# Patient Record
Sex: Female | Born: 2000 | Race: White | Hispanic: No | Marital: Single | Smoking: Never smoker
Health system: Southern US, Community
[De-identification: ages and names within clinical notes are randomized; demographics above are authoritative.]

## PROBLEM LIST (undated history)

## (undated) ENCOUNTER — Ambulatory Visit: Admission: EM | Payer: Medicaid Other

## (undated) ENCOUNTER — Ambulatory Visit: Admission: EM | Payer: Medicaid Other | Source: Home / Self Care

## (undated) DIAGNOSIS — F909 Attention-deficit hyperactivity disorder, unspecified type: Secondary | ICD-10-CM

## (undated) DIAGNOSIS — F319 Bipolar disorder, unspecified: Secondary | ICD-10-CM

## (undated) DIAGNOSIS — F419 Anxiety disorder, unspecified: Secondary | ICD-10-CM

## (undated) DIAGNOSIS — F32A Depression, unspecified: Secondary | ICD-10-CM

## (undated) HISTORY — PX: TONSILLECTOMY: SUR1361

---
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
## 2018-05-07 ENCOUNTER — Inpatient Hospital Stay (HOSPITAL_COMMUNITY)
Admission: AD | Admit: 2018-05-07 | Discharge: 2018-05-07 | Disposition: A | Discharge status: Home / Self Care | Payer: Medicaid Other | Source: Ambulatory Visit | Attending: Obstetrics and Gynecology | Admitting: Obstetrics and Gynecology

## 2018-05-07 ENCOUNTER — Encounter (HOSPITAL_COMMUNITY): Payer: Self-pay | Admitting: *Deleted

## 2018-05-07 DIAGNOSIS — Z3A00 Weeks of gestation of pregnancy not specified: Secondary | ICD-10-CM | POA: Diagnosis not present

## 2018-05-07 DIAGNOSIS — O26899 Other specified pregnancy related conditions, unspecified trimester: Secondary | ICD-10-CM | POA: Diagnosis present

## 2018-05-07 DIAGNOSIS — O3680X0 Pregnancy with inconclusive fetal viability, not applicable or unspecified: Secondary | ICD-10-CM

## 2018-05-07 NOTE — MAU Note (Signed)
Cynthia Moran is a 17 y.o. at Unknown here in MAU: Was seen at pregnancy care center and sent here; endorses they did not see a gestational sac LMP: irregular periods; unsures Pain score: denies any at this time Denies vaginal bleeding  Vitals:   05/07/18 1446  BP: (!) 109/52  Pulse: 88  Resp: 16  Temp: 98 F (36.7 C)  SpO2: 100%     Lab orders placed from triage: none  Best contact number for patient: [phone]

## 2018-05-07 NOTE — MAU Provider Note (Signed)
Patient sent from pregnancy care center for evaluation of pregnancy of unknown anatomical location.  No IUP or sac was noted upon exam and patient referred to MAU.  Upon arrival to MICU patient denies any pain or bleeding.  I will set patient up for outpatient ultrasound and follow-up in clinic for 1 week.  Ectopic precautions given.

## 2018-05-22 ENCOUNTER — Inpatient Hospital Stay (HOSPITAL_COMMUNITY)
Admission: AD | Admit: 2018-05-22 | Discharge: 2018-05-22 | Discharge status: Against Medical Advice | Payer: Self-pay | Source: Ambulatory Visit | Attending: Obstetrics and Gynecology | Admitting: Obstetrics and Gynecology

## 2018-05-22 NOTE — Progress Notes (Signed)
Pt called for triage, not in lobby.

## 2018-05-22 NOTE — Progress Notes (Signed)
Pt called 3rd time to be triaged, no longer in lobby.  Will discharge.

## 2018-05-22 NOTE — Progress Notes (Signed)
Pt called into triage, not in lobby.

## 2019-10-04 ENCOUNTER — Ambulatory Visit: Payer: Medicaid Other | Attending: Internal Medicine

## 2019-10-04 DIAGNOSIS — Z20822 Contact with and (suspected) exposure to covid-19: Secondary | ICD-10-CM

## 2019-10-05 LAB — NOVEL CORONAVIRUS, NAA: SARS-CoV-2, NAA: NOT DETECTED

## 2020-10-25 ENCOUNTER — Other Ambulatory Visit: Payer: Self-pay

## 2020-10-25 ENCOUNTER — Emergency Department (HOSPITAL_COMMUNITY)
Admission: EM | Admit: 2020-10-25 | Discharge: 2020-10-25 | Disposition: A | Discharge status: Home / Self Care | Payer: Medicaid Other | Attending: Emergency Medicine | Admitting: Emergency Medicine

## 2020-10-25 DIAGNOSIS — R1030 Lower abdominal pain, unspecified: Secondary | ICD-10-CM | POA: Diagnosis not present

## 2020-10-25 DIAGNOSIS — Z5321 Procedure and treatment not carried out due to patient leaving prior to being seen by health care provider: Secondary | ICD-10-CM | POA: Insufficient documentation

## 2020-10-25 LAB — CBC
HCT: 40.2 % (ref 36.0–46.0)
Hemoglobin: 12.9 g/dL (ref 12.0–15.0)
MCH: 28.9 pg (ref 26.0–34.0)
MCHC: 32.1 g/dL (ref 30.0–36.0)
MCV: 90.1 fL (ref 80.0–100.0)
Platelets: 370 10*3/uL (ref 150–400)
RBC: 4.46 MIL/uL (ref 3.87–5.11)
RDW: 13.1 % (ref 11.5–15.5)
WBC: 5.6 10*3/uL (ref 4.0–10.5)
nRBC: 0 % (ref 0.0–0.2)

## 2020-10-25 LAB — COMPREHENSIVE METABOLIC PANEL
ALT: 14 U/L (ref 0–44)
AST: 17 U/L (ref 15–41)
Albumin: 3.8 g/dL (ref 3.5–5.0)
Alkaline Phosphatase: 60 U/L (ref 38–126)
Anion gap: 8 (ref 5–15)
BUN: 11 mg/dL (ref 6–20)
CO2: 28 mmol/L (ref 22–32)
Calcium: 9.1 mg/dL (ref 8.9–10.3)
Chloride: 104 mmol/L (ref 98–111)
Creatinine, Ser: 0.78 mg/dL (ref 0.44–1.00)
GFR, Estimated: >60 mL/min (ref >60)
Glucose, Bld: 76 mg/dL (ref 70–99)
Potassium: 3.9 mmol/L (ref 3.5–5.1)
Sodium: 140 mmol/L (ref 135–145)
Total Bilirubin: 0.6 mg/dL (ref 0.3–1.2)
Total Protein: 6.9 g/dL (ref 6.5–8.1)

## 2020-10-25 LAB — I-STAT BETA HCG BLOOD, ED (MC, WL, AP ONLY): I-stat hCG, quantitative: <5 m[IU]/mL (ref <5)

## 2020-10-25 LAB — LIPASE, BLOOD: Lipase: 29 U/L (ref 11–51)

## 2020-10-25 NOTE — ED Triage Notes (Signed)
Pt states she has lower abd cramps, not sure if it related to possible STD or she may be pregnant.  Recent boyfriend told her he " is hurting in the lower parts" ? discharge

## 2020-10-26 ENCOUNTER — Other Ambulatory Visit: Payer: Self-pay

## 2020-10-26 ENCOUNTER — Encounter (HOSPITAL_COMMUNITY): Payer: Self-pay | Admitting: Emergency Medicine

## 2020-10-26 ENCOUNTER — Emergency Department (HOSPITAL_COMMUNITY)
Admission: EM | Admit: 2020-10-26 | Discharge: 2020-10-26 | Disposition: A | Discharge status: Home / Self Care | Payer: Medicaid Other | Attending: Emergency Medicine | Admitting: Emergency Medicine

## 2020-10-26 DIAGNOSIS — N898 Other specified noninflammatory disorders of vagina: Secondary | ICD-10-CM | POA: Diagnosis not present

## 2020-10-26 DIAGNOSIS — R300 Dysuria: Secondary | ICD-10-CM | POA: Diagnosis present

## 2020-10-26 LAB — URINALYSIS, ROUTINE W REFLEX MICROSCOPIC
Bilirubin Urine: NEGATIVE
Glucose, UA: NEGATIVE mg/dL
Hgb urine dipstick: NEGATIVE
Ketones, ur: NEGATIVE mg/dL
Leukocytes,Ua: NEGATIVE
Nitrite: NEGATIVE
Protein, ur: NEGATIVE mg/dL
Specific Gravity, Urine: 1.021 (ref 1.005–1.030)
pH: 7 (ref 5.0–8.0)

## 2020-10-26 LAB — WET PREP, GENITAL
Sperm: NONE SEEN
Trich, Wet Prep: NONE SEEN
Yeast Wet Prep HPF POC: NONE SEEN

## 2020-10-26 MED ORDER — DOXYCYCLINE HYCLATE 100 MG PO CAPS: 100.0000 mg | ORAL_CAPSULE | Freq: Two times a day (BID) | ORAL | 0 refills | Status: AC

## 2020-10-26 MED ORDER — CEFTRIAXONE SODIUM 1 G IJ SOLR
500.0000 mg | Freq: Once | INTRAMUSCULAR | Status: AC
  Administered 2020-10-26: 500 mg via INTRAMUSCULAR
  Filled 2020-10-26: qty 10

## 2020-10-26 NOTE — ED Triage Notes (Signed)
Patient c/o dysuria and vaginal discomfort x1 week. Seen yesterday and LWBS after triage. Hx chlamydia.

## 2020-10-26 NOTE — Discharge Instructions (Addendum)
Abstain from intercourse until you know your test results. Call the hospital in 3-5 days for your results if you are unable to view results in your MyChart. IF your gonorrhea and chlamydia tests are negative, you may resume intercourse. IF either test is positive, your partner will need to go to the health department for free treatment. IF your test is positive, both partners need to abstain from intercourse for 10 days after treatment (this includes all forms of intercourse).    Take Doxycycline as prescribed and complete the full course. Consider follow up with the health department for HIV and syphilis testing.   There are many options for quick evaluation and management of certain GYN issues that do not require a long wait time or big bill from the emergency department.   Consider these options for your care in the future:   Walk-ins for certain complaints available at:   [facility] [address]  [phone]  See hours at [email or web address]   Center for [employer] [facility] [employer] [facility]  [address]  [phone]   [facility] [employer] [facility] [employer]  [phone]   You can make an appointment to see a GYN provider:   Center for [facility] at [facility]  [address]  [phone]   Center for [facility]  [address] [employer]  [phone]   Center for [facility] at [facility]  [phone]   Center for [facility] at [facility]  [address]  [phone]   If you already have an established OB/GYN provider in the area you can make an appointment with them as well.

## 2020-10-26 NOTE — ED Notes (Signed)
Pt has bene brought back to treatment room. Provider at bedside. Pt is currently getting undressed.

## 2020-10-26 NOTE — ED Provider Notes (Signed)
[facility]-EMERGENCY DEPT Provider Note   CSN: [id number] Arrival date & time: 10/26/20  1807     History Chief Complaint  Patient presents with  . Dysuria    Cynthia Moran is a 20 y.o. female.  20 year old female presents with complaint of abnormal vaginal discharge with dysuria.  States is not feeling her normal UTI as she is not having urinary frequency.  Patient is concerned for STD, states that her boyfriend recently complained of pain in his "private parts."  Patient states that she has been in relationship with this individual for a while however they recently broke up and was seeing other people before getting back together.  Last menstrual cycle was on January 9, did have a negative pregnancy test in the ED yesterday (LWBS).  No other complaints or concerns.        History reviewed. No pertinent past medical history.  There are no problems to display for this patient.   Past Surgical History:  Procedure Laterality Date  . CESAREAN SECTION    . TONSILLECTOMY       OB History   No obstetric history on file.     No family history on file.  Social History   Tobacco Use  . Smoking status: Never Smoker  . Smokeless tobacco: Never Used  Substance Use Topics  . Alcohol use: Not Currently    Comment: occaisonal   . Drug use: Not Currently    Types: Marijuana    Home Medications Prior to Admission medications   Medication Sig Start Date End Date Taking? Authorizing Provider  doxycycline (VIBRAMYCIN) 100 MG capsule Take 1 capsule (100 mg total) by mouth 2 (two) times daily for 7 days. 10/26/20 11/02/20 Yes Jeannie Fend, PA-C  Norgestimate-Ethinyl Estradiol Triphasic 0.18/0.215/0.25 MG-35 MCG tablet Take 1 tablet by mouth daily. 08/31/16  Yes [provider]    Allergies    Patient has no known allergies.  Review of Systems   Review of Systems  Constitutional: Negative for fever.  Gastrointestinal: Negative for abdominal  pain, constipation, diarrhea, nausea and vomiting.  Genitourinary: Positive for dysuria and vaginal discharge. Negative for frequency and vaginal bleeding.  Musculoskeletal: Negative for arthralgias, back pain and myalgias.  Skin: Negative for rash and wound.  Allergic/Immunologic: Negative for immunocompromised state.  All other systems reviewed and are negative.   Physical Exam Updated Vital Signs BP 131/78 (BP Location: Left Arm)   Pulse 85   Temp 98.6 F (37 C) (Oral)   Resp 20   Ht 5\' 2"  (1.575 m)   Wt 52.2 kg   LMP 10/20/2020   SpO2 98%   BMI 21.03 kg/m   Physical Exam Vitals and nursing note reviewed. Exam conducted with a chaperone present.  Constitutional:      General: She is not in acute distress.    Appearance: She is well-developed and well-nourished. She is not diaphoretic.  HENT:     Head: Normocephalic and atraumatic.  Cardiovascular:     Rate and Rhythm: Normal rate and regular rhythm.     Heart sounds: Normal heart sounds.  Pulmonary:     Effort: Pulmonary effort is normal.     Breath sounds: Normal breath sounds.  Abdominal:     Palpations: Abdomen is soft.     Tenderness: There is no abdominal tenderness.  Genitourinary:    Cervix: Discharge present. No cervical motion tenderness, friability or erythema.     Uterus: Not enlarged and not tender.  Adnexa: Right adnexa normal and left adnexa normal.     Comments: Large amount of yellow dc present without CMT.  Skin:    General: Skin is warm and dry.     Findings: No erythema or rash.  Neurological:     Mental Status: She is alert and oriented to person, place, and time.  Psychiatric:        Mood and Affect: Mood and affect normal.        Behavior: Behavior normal.     ED Results / Procedures / Treatments   Labs (all labs ordered are listed, but only abnormal results are displayed) Labs Reviewed  WET PREP, GENITAL - Abnormal; Notable for the following components:      Result Value   Clue  Cells Wet Prep HPF POC PRESENT (*)    WBC, Wet Prep HPF POC MODERATE (*)    All other components within normal limits  URINALYSIS, ROUTINE W REFLEX MICROSCOPIC - Abnormal; Notable for the following components:   APPearance HAZY (*)    All other components within normal limits  GC/CHLAMYDIA PROBE AMP ([place]) NOT AT [facility]    EKG None  Radiology No results found.  Procedures Procedures (including critical care time)  Medications Ordered in ED Medications  cefTRIAXone (ROCEPHIN) injection 500 mg (500 mg Intramuscular Given 10/26/20 2110)    ED Course  I have reviewed the triage vital signs and the nursing notes.  Pertinent labs & imaging results that were available during my care of the patient were reviewed by me and considered in my medical decision making (see chart for details).  Clinical Course as of 10/26/20 2140  Sat Oct 26, 2020  218 20 year old female with complaint of vaginal discharge as above.  On exam found to have large amount of yellow vaginal discharge without cervical motion tenderness or adnexal tenderness.  Plan is to cover with doxycycline and Rocephin today.  Wet prep pending and will result today, we discharged after wet prep available.  GC chlamydia sent out, patient is aware she will need to follow-up with her results.  Advised to abstain from intercourse for the next 10 days and be sure to have partner treated if any of her test results are positive. Labs reviewed from yesterday, CBC and CMP unremarkable, hcg negative. [LM]  2053 Wet prep is positive for clue cells, plan is to treat with Rocephin and doxycycline for suspected STD at this time.  Patient can follow-up with gynecology clinic for further complaints or if symptomatic for her BV. UA unremarkable, negative for UTI. [LM]    Clinical Course User Index [LM] Alden Hipp   MDM Rules/Calculators/A&P                          Final Clinical Impression(s) / ED Diagnoses Final diagnoses:   Vaginal discharge    Rx / DC Orders ED Discharge Orders         Ordered    doxycycline (VIBRAMYCIN) 100 MG capsule  2 times daily        10/26/20 2139           Jeannie Fend, PA-C 10/26/20 2140    Terrilee Files, MD 10/27/20 1003

## 2020-10-28 LAB — GC/CHLAMYDIA PROBE AMP (~~LOC~~) NOT AT ARMC
Chlamydia: NEGATIVE
Comment: NEGATIVE
Comment: NORMAL
Neisseria Gonorrhea: NEGATIVE

## 2020-11-01 ENCOUNTER — Encounter (HOSPITAL_COMMUNITY): Payer: Self-pay

## 2020-11-01 ENCOUNTER — Emergency Department (HOSPITAL_COMMUNITY)
Admission: EM | Admit: 2020-11-01 | Discharge: 2020-11-01 | Disposition: A | Discharge status: Home / Self Care | Payer: Medicaid Other | Attending: Emergency Medicine | Admitting: Emergency Medicine

## 2020-11-01 ENCOUNTER — Other Ambulatory Visit: Payer: Self-pay

## 2020-11-01 DIAGNOSIS — J069 Acute upper respiratory infection, unspecified: Secondary | ICD-10-CM | POA: Insufficient documentation

## 2020-11-01 DIAGNOSIS — Z20822 Contact with and (suspected) exposure to covid-19: Secondary | ICD-10-CM | POA: Diagnosis not present

## 2020-11-01 DIAGNOSIS — Z9089 Acquired absence of other organs: Secondary | ICD-10-CM | POA: Insufficient documentation

## 2020-11-01 DIAGNOSIS — R059 Cough, unspecified: Secondary | ICD-10-CM | POA: Diagnosis present

## 2020-11-01 MED ORDER — BENZONATATE 100 MG PO CAPS: 100.0000 mg | ORAL_CAPSULE | Freq: Three times a day (TID) | ORAL | 0 refills | Status: DC

## 2020-11-01 MED ORDER — FLUTICASONE PROPIONATE 50 MCG/ACT NA SUSP: 2.0000 | Freq: Every day | NASAL | 0 refills | Status: DC

## 2020-11-01 NOTE — Discharge Instructions (Addendum)
I prescribed you 2 medications.  First medication is called Flonase.  Studies have shown that using Flonase 2 times a day has helped significantly reduce the chances of being hospitalized for COVID-19.  I would recommend using this 2 times per day in each nostril.  The other medication is called Tessalon Perles.  This is a cough medication you can take up to 3 times a day for your cough.  If you find that it is not effective, you can try over-the-counter medications such as Robitussin or Delsym.  If you develop new or worsening symptoms, please return to the emergency department.  Otherwise, please follow-up with your primary care provider.  It was a pleasure to meet you.  Check the results of your COVID-19 test on MyChart.  If it is positive, you need to quarantine for 10 days from symptom onset.

## 2020-11-01 NOTE — ED Triage Notes (Signed)
Patient c/o chills, cough, emesis, and sore throat. Patient states one of her friends told her that their parent had Covid.

## 2020-11-01 NOTE — ED Provider Notes (Signed)
[facility]-EMERGENCY DEPT Provider Note   CSN: [id number] Arrival date & time: 11/01/20  1549     History No chief complaint on file.   Cynthia Moran is a 20 y.o. female.  HPI Patient is a 20 year old female who presents to the emergency department due to a cough.  Reports associated body aches, chills, vomiting, as well as sore throat.  Symptoms started about 2 to 3 days ago.  She was around a friend who had a COVID positive parent prior to the onset of her symptoms.  She has been vaccinated for COVID x1 and also notes having had COVID in the past.  Denies any significant shortness of breath.  Presents today for COVID-19 testing.  No abdominal pain or diarrhea.     History reviewed. No pertinent past medical history.  There are no problems to display for this patient.   Past Surgical History:  Procedure Laterality Date  . CESAREAN SECTION    . TONSILLECTOMY       OB History   No obstetric history on file.     Family History  Problem Relation Age of Onset  . Healthy Mother   . Healthy Father     Social History   Tobacco Use  . Smoking status: Never Smoker  . Smokeless tobacco: Never Used  Vaping Use  . Vaping Use: Some days  . Substances: Nicotine  Substance Use Topics  . Alcohol use: Not Currently    Comment: occaisonal   . Drug use: Not Currently    Types: Marijuana    Home Medications Prior to Admission medications   Medication Sig Start Date End Date Taking? Authorizing Provider  benzonatate (TESSALON) 100 MG capsule Take 1 capsule (100 mg total) by mouth every 8 (eight) hours. 11/01/20  Yes Placido Sou, PA-C  fluticasone (FLONASE) 50 MCG/ACT nasal spray Place 2 sprays into both nostrils daily. 11/01/20  Yes Placido Sou, PA-C  doxycycline (VIBRAMYCIN) 100 MG capsule Take 1 capsule (100 mg total) by mouth 2 (two) times daily for 7 days. 10/26/20 11/02/20  Jeannie Fend, PA-C  Norgestimate-Ethinyl Estradiol Triphasic  0.18/0.215/0.25 MG-35 MCG tablet Take 1 tablet by mouth daily. 08/31/16   [provider]    Allergies    Patient has no known allergies.  Review of Systems   Review of Systems  Constitutional: Positive for chills and fatigue. Negative for fever.  HENT: Positive for congestion, postnasal drip, rhinorrhea, sneezing and sore throat. Negative for ear discharge, ear pain and trouble swallowing.   Respiratory: Positive for cough. Negative for shortness of breath.   Gastrointestinal: Positive for vomiting. Negative for abdominal pain and diarrhea.   Physical Exam Updated Vital Signs BP 110/64 (BP Location: Left Arm)   Pulse 71   Temp 97.7 F (36.5 C) (Oral)   Resp 16   Ht 5\' 2"  (1.575 m)   Wt 52.2 kg   LMP 10/20/2020   SpO2 99%   BMI 21.03 kg/m   Physical Exam Vitals and nursing note reviewed.  Constitutional:      General: She is not in acute distress.    Appearance: Normal appearance. She is not ill-appearing, toxic-appearing or diaphoretic.  HENT:     Head: Normocephalic and atraumatic.     Right Ear: External ear normal.     Left Ear: External ear normal.     Nose: Nose normal.     Mouth/Throat:     Mouth: Mucous membranes are moist.     Pharynx: Oropharynx  is clear. No oropharyngeal exudate or posterior oropharyngeal erythema.     Comments: Status post tonsillectomy.  Uvula midline.  No significant erythema noted in the posterior oropharynx.  No exudates.  Readily handling secretions.  No hot potato voice. Eyes:     Extraocular Movements: Extraocular movements intact.  Cardiovascular:     Rate and Rhythm: Normal rate and regular rhythm.     Pulses: Normal pulses.     Heart sounds: Normal heart sounds. No murmur heard. No friction rub. No gallop.      Comments: Regular rate and rhythm without murmurs, rubs, or gallops. Pulmonary:     Effort: Pulmonary effort is normal. No respiratory distress.     Breath sounds: Normal breath sounds. No stridor. No wheezing,  rhonchi or rales.     Comments: Lungs are clear to auscultation bilaterally. Abdominal:     General: Abdomen is flat.     Palpations: Abdomen is soft.     Tenderness: There is no abdominal tenderness.     Comments: Abdomen is flat, soft, and nontender.  Musculoskeletal:        General: Normal range of motion.     Cervical back: Normal range of motion and neck supple. No tenderness.  Skin:    General: Skin is warm and dry.  Neurological:     General: No focal deficit present.     Mental Status: She is alert and oriented to person, place, and time.  Psychiatric:        Mood and Affect: Mood normal.        Behavior: Behavior normal.     ED Results / Procedures / Treatments   Labs (all labs ordered are listed, but only abnormal results are displayed) Labs Reviewed  SARS CORONAVIRUS 2 (TAT 6-24 HRS)    EKG None  Radiology No results found.  Procedures Procedures (including critical care time)  Medications Ordered in ED Medications - No data to display  ED Course  I have reviewed the triage vital signs and the nursing notes.  Pertinent labs & imaging results that were available during my care of the patient were reviewed by me and considered in my medical decision making (see chart for details).    MDM Rules/Calculators/A&P                          Pt is a 20 y.o. female who presents to the emergency department with multiple symptoms consistent with viral URI.  Vaccinated for COVID-19 x1.  Also reports having had COVID-19 in the past.  No significant shortness of breath.  Not hypoxic.  Afebrile.  Not tachycardic.  Physical exam reassuring.  Patient is nontoxic-appearing.  We will obtain a 6 to 24-hour COVID-19 test.  Patient has MyChart and is going to check the results at home.  She understands to quarantine for 10 to 14 days from symptom onset.  We discussed return precautions.  We will discharge patient on Tessalon Perles as well as Flonase.  Discussed OTC medications as  well.  Negative pregnancy test 1 week ago and patient denies any sexual activity since prior visit.  Her questions were answered and she was amicable at the time of discharge.  Her vital signs are stable.  Note: Portions of this report may have been transcribed using voice recognition software. Every effort was made to ensure accuracy; however, inadvertent computerized transcription errors may be present.   Final Clinical Impression(s) / ED Diagnoses Final diagnoses:  Viral URI with cough   Rx / DC Orders ED Discharge Orders         Ordered    fluticasone (FLONASE) 50 MCG/ACT nasal spray  Daily        11/01/20 1627    benzonatate (TESSALON) 100 MG capsule  Every 8 hours        11/01/20 1627           Placido Sou, PA-C 11/01/20 1632    Terald Sleeper, MD 11/01/20 2208

## 2020-11-02 LAB — SARS CORONAVIRUS 2 (TAT 6-24 HRS): SARS Coronavirus 2: NEGATIVE

## 2020-12-19 ENCOUNTER — Other Ambulatory Visit: Payer: Self-pay

## 2020-12-19 ENCOUNTER — Encounter (HOSPITAL_COMMUNITY): Payer: Self-pay | Admitting: Pharmacy Technician

## 2020-12-19 ENCOUNTER — Emergency Department (HOSPITAL_COMMUNITY)
Admission: EM | Admit: 2020-12-19 | Discharge: 2020-12-19 | Disposition: A | Discharge status: Home / Self Care | Payer: Medicaid Other | Attending: Emergency Medicine | Admitting: Emergency Medicine

## 2020-12-19 DIAGNOSIS — Z5321 Procedure and treatment not carried out due to patient leaving prior to being seen by health care provider: Secondary | ICD-10-CM | POA: Diagnosis not present

## 2020-12-19 DIAGNOSIS — N898 Other specified noninflammatory disorders of vagina: Secondary | ICD-10-CM | POA: Diagnosis not present

## 2020-12-19 NOTE — ED Triage Notes (Signed)
Pt reports vaginal itching, discharge and odor for 2 days. Denies having unprotected sex.

## 2021-01-07 ENCOUNTER — Ambulatory Visit
Admission: EM | Admit: 2021-01-07 | Discharge: 2021-01-07 | Disposition: A | Discharge status: Home / Self Care | Payer: Medicaid Other | Attending: Physician Assistant | Admitting: Physician Assistant

## 2021-01-07 ENCOUNTER — Encounter: Payer: Self-pay | Admitting: Emergency Medicine

## 2021-01-07 ENCOUNTER — Other Ambulatory Visit: Payer: Self-pay

## 2021-01-07 DIAGNOSIS — B9689 Other specified bacterial agents as the cause of diseases classified elsewhere: Secondary | ICD-10-CM | POA: Diagnosis present

## 2021-01-07 DIAGNOSIS — N939 Abnormal uterine and vaginal bleeding, unspecified: Secondary | ICD-10-CM | POA: Diagnosis not present

## 2021-01-07 DIAGNOSIS — N760 Acute vaginitis: Secondary | ICD-10-CM | POA: Diagnosis not present

## 2021-01-07 LAB — WET PREP, GENITAL
Sperm: NONE SEEN
Trich, Wet Prep: NONE SEEN
Yeast Wet Prep HPF POC: NONE SEEN

## 2021-01-07 LAB — PREGNANCY, URINE: Preg Test, Ur: NEGATIVE

## 2021-01-07 MED ORDER — METRONIDAZOLE 500 MG PO TABS: 500.0000 mg | ORAL_TABLET | Freq: Two times a day (BID) | ORAL | 0 refills | Status: AC

## 2021-01-07 NOTE — ED Triage Notes (Signed)
Patient states she was seen 2 weeks ago for BV but never got the treatment. She also reports vaginal bleeding with clots that started 2 days ago but her last menstrual cycle was 2 weeks ago.

## 2021-01-07 NOTE — ED Provider Notes (Signed)
[facility]    CSN: [id number] Arrival date & time: 01/07/21  1022      History   Chief Complaint Chief Complaint  Patient presents with  . Vaginal Bleeding    HPI Cynthia Moran is a 20 y.o. female presenting for concerns about abnormal vaginal bleeding.  Patient states that she has had 2-day history of clumpy/clotted vaginal bleeding whenever she goes to the bathroom.  She admits to occasional blood clots on her pad.  She says it has slowed down from yesterday.  Patient says it looks like mucus also.  She reports some pelvic/abdominal pain with intercourse but not otherwise.  Patient was seen in the emergency department a couple of weeks ago for vaginal discharge and was diagnosed with bacterial vaginosis.  She was prescribed MetroGel, but says the pharmacy never received it so she did not take it.  She was tested for STIs at that time and results were negative.  She says that her and her boyfriend broke up and they both had other sexual partners.  She says that they both told he showed a use protection but she was not convinced so that is why she went to the ER to get STI tested.  Patient takes an oral contraceptive and states that her periods are usually very regular and once a month.  She has never had any midcycle or breakthrough bleeding.  Has been on this form of birth control for a few months.  She denies any fever, fatigue or weakness, nausea/vomiting, dysuria or urinary frequency.  No bleeding from other sites.  No other concerns.  HPI  History reviewed. No pertinent past medical history.  There are no problems to display for this patient.   Past Surgical History:  Procedure Laterality Date  . CESAREAN SECTION    . TONSILLECTOMY      OB History   No obstetric history on file.      Home Medications    Prior to Admission medications   Medication Sig Start Date End Date Taking? Authorizing Provider  metroNIDAZOLE (FLAGYL) 500 MG tablet Take 1 tablet (500  mg total) by mouth 2 (two) times daily for 7 days. 01/07/21 01/14/21 Yes Shirlee Latch, PA-C  Norgestimate-Ethinyl Estradiol Triphasic 0.18/0.215/0.25 MG-35 MCG tablet Take 1 tablet by mouth daily. 08/31/16  Yes [provider]  benzonatate (TESSALON) 100 MG capsule Take 1 capsule (100 mg total) by mouth every 8 (eight) hours. 11/01/20   Placido Sou, PA-C  fluticasone (FLONASE) 50 MCG/ACT nasal spray Place 2 sprays into both nostrils daily. 11/01/20   Placido Sou, PA-C    Family History Family History  Problem Relation Age of Onset  . Healthy Mother   . Healthy Father     Social History Social History   Tobacco Use  . Smoking status: Never Smoker  . Smokeless tobacco: Never Used  Vaping Use  . Vaping Use: Some days  . Substances: Nicotine  Substance Use Topics  . Alcohol use: Not Currently    Comment: occaisonal   . Drug use: Not Currently    Types: Marijuana     Allergies   Patient has no known allergies.   Review of Systems Review of Systems  Constitutional: Negative for chills, fatigue and fever.  Gastrointestinal: Negative for abdominal pain, diarrhea, nausea and vomiting.  Genitourinary: Positive for dyspareunia, menstrual problem, vaginal bleeding and vaginal discharge. Negative for decreased urine volume, difficulty urinating, dysuria, flank pain, frequency, hematuria, pelvic pain, urgency and vaginal pain.  Musculoskeletal: Negative for back pain.  Skin: Negative for rash.  Hematological: Does not bruise/bleed easily.     Physical Exam Triage Vital Signs ED Triage Vitals  Enc Vitals Group     BP 01/07/21 1041 109/68     Pulse Rate 01/07/21 1041 96     Resp 01/07/21 1041 18     Temp 01/07/21 1041 98 F (36.7 C)     Temp Source 01/07/21 1041 Oral     SpO2 01/07/21 1041 98 %     Weight 01/07/21 1039 110 lb (49.9 kg)     Height 01/07/21 1039 5' (1.524 m)     Head Circumference --      Peak Flow --      Pain Score 01/07/21 1039 7      Pain Loc --      Pain Edu? --      Excl. in GC? --    No data found.  Updated Vital Signs BP 109/68 (BP Location: Left Arm)   Pulse 96   Temp 98 F (36.7 C) (Oral)   Resp 18   Ht 5' (1.524 m)   Wt 110 lb (49.9 kg)   LMP 12/24/2020   SpO2 98%   BMI 21.48 kg/m       Physical Exam Vitals and nursing note reviewed.  Constitutional:      General: She is not in acute distress.    Appearance: Normal appearance. She is not ill-appearing or toxic-appearing.  HENT:     Head: Normocephalic and atraumatic.  Eyes:     General: No scleral icterus.       Right eye: No discharge.        Left eye: No discharge.     Conjunctiva/sclera: Conjunctivae normal.  Cardiovascular:     Rate and Rhythm: Normal rate and regular rhythm.     Heart sounds: Normal heart sounds.  Pulmonary:     Effort: Pulmonary effort is normal. No respiratory distress.     Breath sounds: Normal breath sounds.  Abdominal:     Palpations: Abdomen is soft.     Tenderness: There is no abdominal tenderness. There is no right CVA tenderness or left CVA tenderness.  Genitourinary:    Comments: Deferred-patient declined Musculoskeletal:     Cervical back: Neck supple.  Skin:    General: Skin is dry.  Neurological:     General: No focal deficit present.     Mental Status: She is alert. Mental status is at baseline.     Motor: No weakness.     Gait: Gait normal.  Psychiatric:        Mood and Affect: Mood normal.        Behavior: Behavior normal.        Thought Content: Thought content normal.      UC Treatments / Results  Labs (all labs ordered are listed, but only abnormal results are displayed) Labs Reviewed  WET PREP, GENITAL - Abnormal; Notable for the following components:      Result Value   Clue Cells Wet Prep HPF POC PRESENT (*)    WBC, Wet Prep HPF POC MANY (*)    All other components within normal limits  PREGNANCY, URINE    EKG   Radiology No results found.  Procedures Procedures  (including critical care time)  Medications Ordered in UC Medications - No data to display  Initial Impression / Assessment and Plan / UC Course  I have reviewed the triage vital signs  and the nursing notes.  Pertinent labs & imaging results that were available during my care of the patient were reviewed by me and considered in my medical decision making (see chart for details).   20 y/o female presents for abnormal vaginal bleeding x2 days.  Tested positive in the ED for BV but not treated.  All vital signs normal and stable. Advised pelvic exam but she declines at this time, and self swabbed for wet prep. Negative pregnancy.  Wet prep positive for clue cells.  Will treat for BV infection with metronidazole. Patient declines retesting for STIs since she tested -2 weeks ago.  Patient declines pelvic exam today.  She has decided to monitor the condition of the bleeding becomes severe to go to the ED.  Otherwise, she will call her gynecologist for appointment and consideration of a different type of birth control.  I reviewed ED red flag signs or symptoms for abnormal vaginal bleeding and pelvic pain.  Final Clinical Impressions(s) / UC Diagnoses   Final diagnoses:  Abnormal vaginal bleeding  BV (bacterial vaginosis)     Discharge Instructions     Your pregnancy test is negative.  Your vaginal swab is indicative of BV infection.  I have sent metronidazole to the pharmacy.  Make sure you take the full course.  If there is a problem with the pharmacy receiving medication, please call us and I can send it somewhere else.  You need to be treated for this infection.  You have decided to hold off on a pelvic exam today.  Continue to monitor your bleeding and if you have continued bleeding after another 3 days or so you should be seen again.  The bleeding is starting to slow down and you can continue to monitor.  It is not extremely uncommon to have some midcycle bleeding every now and then.   If you have any fatigue with this or pelvic/abdominal pain then you need to go to the emergency room.  Do not have any sex until this is improving.  We did not repeat STI testing since you just had that done a couple weeks ago.    ED Prescriptions    Medication Sig Dispense Auth. Provider   metroNIDAZOLE (FLAGYL) 500 MG tablet Take 1 tablet (500 mg total) by mouth 2 (two) times daily for 7 days. 14 tablet Gareth Morgan     PDMP not reviewed this encounter.   Shirlee Latch, PA-C 01/07/21 1143

## 2021-01-07 NOTE — Discharge Instructions (Signed)
Your pregnancy test is negative.  Your vaginal swab is indicative of BV infection.  I have sent metronidazole to the pharmacy.  Make sure you take the full course.  If there is a problem with the pharmacy receiving medication, please call us and I can send it somewhere else.  You need to be treated for this infection.  You have decided to hold off on a pelvic exam today.  Continue to monitor your bleeding and if you have continued bleeding after another 3 days or so you should be seen again.  The bleeding is starting to slow down and you can continue to monitor.  It is not extremely uncommon to have some midcycle bleeding every now and then.  If you have any fatigue with this or pelvic/abdominal pain then you need to go to the emergency room.  Do not have any sex until this is improving.  We did not repeat STI testing since you just had that done a couple weeks ago.

## 2021-04-01 ENCOUNTER — Encounter: Payer: Self-pay | Admitting: *Deleted

## 2021-04-01 ENCOUNTER — Other Ambulatory Visit: Payer: Self-pay

## 2021-04-01 ENCOUNTER — Emergency Department
Admission: EM | Admit: 2021-04-01 | Discharge: 2021-04-02 | Disposition: A | Discharge status: Home / Self Care | Payer: Medicaid Other | Attending: Emergency Medicine | Admitting: Emergency Medicine

## 2021-04-01 DIAGNOSIS — N760 Acute vaginitis: Secondary | ICD-10-CM | POA: Diagnosis not present

## 2021-04-01 DIAGNOSIS — N939 Abnormal uterine and vaginal bleeding, unspecified: Secondary | ICD-10-CM

## 2021-04-01 DIAGNOSIS — B9689 Other specified bacterial agents as the cause of diseases classified elsewhere: Secondary | ICD-10-CM | POA: Insufficient documentation

## 2021-04-01 MED ORDER — METRONIDAZOLE 500 MG/100ML IV SOLN
500.0000 mg | Freq: Once | INTRAVENOUS | Status: AC
  Administered 2021-04-01: 500 mg via INTRAVENOUS
  Filled 2021-04-01: qty 100

## 2021-04-01 MED ORDER — METRONIDAZOLE 500 MG PO TABS: 500.0000 mg | ORAL_TABLET | Freq: Two times a day (BID) | ORAL | 0 refills | Status: AC

## 2021-04-01 NOTE — ED Provider Notes (Signed)
[facility] Emergency Department Provider Note   ____________________________________________   Event Date/Time   First MD Initiated Contact with Patient 04/01/21 2121     (approximate)  I have reviewed the triage vital signs and the nursing notes.   HISTORY  Chief Complaint Vaginal Bleeding    HPI Cynthia Moran is a 20 y.o. female with the below stated past medical history presents for a small amount of vaginal bleeding and vaginal itching that is been going on for the last 3 days.  Patient states that this symptoms are similar to when she has had bacterial vaginosis in the past.  Patient states that she has tried to treat this at home by taking over-the-counter medications and have not improved any of the symptoms.  Patient states that she has to use 1 pad per day due to a small amount of bleeding.  Patient states her last menstrual period was 1 week prior to arrival.  Patient currently denies any vision changes, tinnitus, difficulty speaking, facial droop, sore throat, chest pain, shortness of breath, abdominal pain, nausea/vomiting/diarrhea, or weakness/numbness/paresthesias in any extremity         No past medical history on file.  There are no problems to display for this patient.   Past Surgical History:  Procedure Laterality Date   CESAREAN SECTION     TONSILLECTOMY      Prior to Admission medications   Medication Sig Start Date End Date Taking? Authorizing Provider  metroNIDAZOLE (FLAGYL) 500 MG tablet Take 1 tablet (500 mg total) by mouth 2 (two) times daily for 7 days. 04/01/21 04/08/21 Yes Verdelle Valtierra, Clent Jacks, MD  benzonatate (TESSALON) 100 MG capsule Take 1 capsule (100 mg total) by mouth every 8 (eight) hours. 11/01/20   Placido Sou, PA-C  fluticasone (FLONASE) 50 MCG/ACT nasal spray Place 2 sprays into both nostrils daily. 11/01/20   Placido Sou, PA-C  Norgestimate-Ethinyl Estradiol Triphasic 0.18/0.215/0.25 MG-35 MCG tablet Take 1  tablet by mouth daily. 08/31/16   [provider]    Allergies Patient has no known allergies.  Family History  Problem Relation Age of Onset   Healthy Mother    Healthy Father     Social History Social History   Tobacco Use   Smoking status: Never   Smokeless tobacco: Never  Vaping Use   Vaping Use: Some days   Substances: Nicotine  Substance Use Topics   Alcohol use: Not Currently    Comment: occaisonal    Drug use: Not Currently    Types: Marijuana    Review of Systems Constitutional: No fever/chills Eyes: No visual changes. ENT: No sore throat. Cardiovascular: Denies chest pain. Respiratory: Denies shortness of breath. Gastrointestinal: No abdominal pain.  No nausea, no vomiting.  No diarrhea. Genitourinary: Positive for dysuria and a small amount of vaginal bleeding Musculoskeletal: Negative for acute arthralgias Skin: Negative for rash. Neurological: Negative for headaches, weakness/numbness/paresthesias in any extremity Psychiatric: Negative for suicidal ideation/homicidal ideation   ____________________________________________   PHYSICAL EXAM:  VITAL SIGNS: ED Triage Vitals  Enc Vitals Group     BP 04/01/21 2111 136/90     Pulse Rate 04/01/21 2111 (!) 118     Resp 04/01/21 2111 16     Temp 04/01/21 2111 97.7 F (36.5 C)     Temp Source 04/01/21 2111 Oral     SpO2 04/01/21 2111 100 %     Weight 04/01/21 2110 120 lb (54.4 kg)     Height 04/01/21 2110 5\' 1"  (1.549  m)     Head Circumference --      Peak Flow --      Pain Score 04/01/21 2110 0     Pain Loc --      Pain Edu? --      Excl. in GC? --    Constitutional: Alert and oriented. Well appearing and in no acute distress. Eyes: Conjunctivae are normal. PERRL. Head: Atraumatic. Nose: No congestion/rhinnorhea. Mouth/Throat: Mucous membranes are moist. Neck: No stridor Cardiovascular: Grossly normal heart sounds.  Good peripheral circulation. Respiratory: Normal respiratory effort.   No retractions. Gastrointestinal: Soft and nontender. No distention. Genitourinary: Patient deferred exam as she states she wanted to self swab.  Patient refused pelvic exam as well Musculoskeletal: No obvious deformities Neurologic:  Normal speech and language. No gross focal neurologic deficits are appreciated. Skin:  Skin is warm and dry. No rash noted. Psychiatric: Mood and affect are normal. Speech and behavior are normal.  ____________________________________________   LABS (all labs ordered are listed, but only abnormal results are displayed)  Labs Reviewed  CHLAMYDIA/NGC RT PCR ([facility] ONLY)            URINALYSIS, COMPLETE (UACMP) WITH MICROSCOPIC   PROCEDURES  Procedure(s) performed (including Critical Care):  Procedures   ____________________________________________   INITIAL IMPRESSION / ASSESSMENT AND PLAN / ED COURSE  As part of my medical decision making, I reviewed the following data within the electronic MEDICAL RECORD NUMBER Nursing notes reviewed and incorporated, Labs reviewed, EKG interpreted, Old chart reviewed, Radiograph reviewed and Notes from prior ED visits reviewed and incorporated        1 day vaginal bleeding most likely of nonemergent etiology.  Workup: UA, GC/chlamydia  Based on History, Exam, and Workup I believe the patient's presentation not consistent with ectopic pregnancy, molar pregnancy, life-threatening coagulopathy, trauma, serious bacterial infection, central process or other emergency.  Patient Stable Appearing and presentation most likely secondary to fibroids or other non-emergent cause of abnormal uterine bleeding. Will treat empirically for bacterial vaginosis this patient states that she does not want a wait for her results.  Will provide prescription with metronidazole  Disposition: Will discharge home with return precautions and instruction for prompt OBGYN follow up.       ____________________________________________   FINAL CLINICAL IMPRESSION(S) / ED DIAGNOSES  Final diagnoses:  BV (bacterial vaginosis)  Abnormal vaginal bleeding     ED Discharge Orders          Ordered    metroNIDAZOLE (FLAGYL) 500 MG tablet  2 times daily        04/01/21 2139             Note:  This document was prepared using Dragon voice recognition software and may include unintentional dictation errors.    Merwyn Katos, MD 04/01/21 [phone]

## 2021-04-01 NOTE — ED Triage Notes (Signed)
Pt says that she has had vaginal bleeding for several days with an odor. Pt denies pain.

## 2021-04-02 LAB — URINALYSIS, COMPLETE (UACMP) WITH MICROSCOPIC
Bacteria, UA: NONE SEEN
Bilirubin Urine: NEGATIVE
Glucose, UA: NEGATIVE mg/dL
Hgb urine dipstick: NEGATIVE
Ketones, ur: NEGATIVE mg/dL
Leukocytes,Ua: NEGATIVE
Nitrite: NEGATIVE
Protein, ur: NEGATIVE mg/dL
Specific Gravity, Urine: 1.018 (ref 1.005–1.030)
pH: 5 (ref 5.0–8.0)

## 2021-07-08 ENCOUNTER — Ambulatory Visit (HOSPITAL_BASED_OUTPATIENT_CLINIC_OR_DEPARTMENT_OTHER): Payer: Medicaid Other | Admitting: Nurse Practitioner

## 2021-08-25 ENCOUNTER — Ambulatory Visit (HOSPITAL_BASED_OUTPATIENT_CLINIC_OR_DEPARTMENT_OTHER): Payer: Medicaid Other | Admitting: Nurse Practitioner

## 2021-08-25 NOTE — Patient Instructions (Incomplete)
Thank you for choosing [facility] for your Primary Care needs. I am excited for the opportunity to partner with you to meet your health care goals. It was a pleasure meeting you today! ° °Recommendations from today's visit: °*** ° °Information on diet, exercise, and health maintenance recommendations are listed below. This is information to help you be sure you are on track for optimal health and monitoring.  ° °Please look over this and let us know if you have any questions or if you have completed any of the health maintenance outside of [facility] so that we can be sure your records are up to date.  °___________________________________________________________ °About Me: °I am an Adult-Geriatric Nurse Practitioner with a background in caring for patients for more than 20 years with a strong intensive care background. I provide primary care and sports medicine services to patients age 13 and older within this office. My education had a strong focus on caring for the older adult population, which I am passionate about. I am also the director of the [employer] with [employer].  ° °My desire is to provide you with the best service through preventive medicine and supportive care. I consider you a part of the medical team and value your input. I work diligently to ensure that you are heard and your needs are met in a safe and effective manner. I want you to feel comfortable with me as your provider and want you to know that your health concerns are important to me. ° °For your information, our office hours are: °Monday, Tuesday, and Thursday 8:00 AM - 5:00 PM °Wednesday and Friday 8:00 AM - 12:00 PM.  ° °In my time away from the office I am teaching new APP's within the system and am unavailable, but my partner, Dr. deCuba is in the office for emergent needs.  ° °If you have questions or concerns, please call our office at [phone] or send us a MyChart message and we will  respond as quickly as possible.  °____________________________________________________________ °MyChart:  °For all urgent or time sensitive needs we ask that you please call the office to avoid delays. Our number is [phone]. °MyChart is not constantly monitored and due to the large volume of messages a day, replies may take up to 72 business hours. ° °MyChart Policy: °MyChart allows for you to see your visit notes, after visit summary, provider recommendations, lab and tests results, make an appointment, request refills, and contact your provider or the office for non-urgent questions or concerns. Providers are seeing patients during normal business hours and do not have built in time to review MyChart messages.  °We ask that you allow a minimum of 3 business days for responses to MyChart messages. For this reason, please do not send urgent requests through MyChart. Please call the office at [phone]. °New and ongoing conditions may require a visit. We have virtual and in person visit available for your convenience.  °Complex MyChart concerns may require a visit. Your provider may request you schedule a virtual or in person visit to ensure we are providing the best care possible. °MyChart messages sent after 11:00 AM on Friday will not be received by the provider until Monday morning.  °  °Lab and Test Results: °You will receive your lab and test results on MyChart as soon as they are completed and results have been sent by the lab or testing facility. Due to this service, you will receive your results   BEFORE your provider.  I review lab and tests results each morning prior to seeing patients. Some results require collaboration with other providers to ensure you are receiving the most appropriate care. For this reason, we ask that you please allow a minimum of 3-5 business days from the time the ALL results have been received for your provider to receive and review lab and test results and contact you  about these.  Most lab and test result comments from the provider will be sent through [facility]. Your provider may recommend changes to the plan of care, follow-up visits, repeat testing, ask questions, or request an office visit to discuss these results. You may reply directly to this message or call the office at [phone] to provide information for the provider or set up an appointment. In some instances, you will be called with test results and recommendations. Please let us know if this is preferred and we will make note of this in your chart to provide this for you.    If you have not heard a response to your lab or test results in 5 business days from all results returning to [place], please call the office to let us know. We ask that you please avoid calling prior to this time unless there is an emergent concern. Due to high call volumes, this can delay the resulting process.  After Hours: For all non-emergency after hours needs, please call the office at [phone] and select the option to reach the on-call provider service. On-call services are shared between multiple [facility] offices and therefore it will not be possible to speak directly with your provider. On-call providers may provide medical advice and recommendations, but are unable to provide refills for maintenance medications.  For all emergency or urgent medical needs after normal business hours, we recommend that you seek care at the closest Urgent Care or Emergency Department to ensure appropriate treatment in a timely manner.  [facility] has a 24 hour emergency room located on the ground floor for your convenience.   Urgent Concerns During the Business Day Providers are seeing patients from 8AM to [place] with a busy schedule and are most often not able to respond to non-urgent calls until the end of the day or the next business day. If you should have URGENT concerns during the day, please call and speak  to the nurse or schedule a same day appointment so that we can address your concern without delay.   Thank you, again, for choosing me as your health care partner. I appreciate your trust and look forward to learning more about you.   Cynthia Keeler, DNP, AGNP-c ___________________________________________________________  Health Maintenance Recommendations Screening Testing Mammogram Every 1 -2 years based on history and risk factors Starting at age 110 Pap Smear Ages 21-39 every 3 years Ages 55-65 every 5 years with HPV testing More frequent testing may be required based on results and history Colon Cancer Screening Every 1-10 years based on test performed, risk factors, and history Starting at age 6 Bone Density Screening Every 2-10 years based on history Starting at age 38 for women Recommendations for men differ based on medication usage, history, and risk factors AAA Screening One time ultrasound Men 80-19 years old who have every smoked Lung Cancer Screening Low Dose Lung CT every 12 months Age 57-80 years with a 30 pack-year smoking history who still smoke or who have quit within the last 15 years  Screening Labs Routine  Labs: Complete  Blood Count (CBC), Complete Metabolic Panel (CMP), Cholesterol (Lipid Panel) Every 6-12 months based on history and medications May be recommended more frequently based on current conditions or previous results Hemoglobin A1c Lab Every 3-12 months based on history and previous results Starting at age 66 or earlier with diagnosis of diabetes, high cholesterol, BMI >26, and/or risk factors Frequent monitoring for patients with diabetes to ensure blood sugar control Thyroid Panel (TSH w/ T3 & T4) Every 6 months based on history, symptoms, and risk factors May be repeated more often if on medication HIV One time testing for all patients 58 and older May be repeated more frequently for patients with increased risk factors or  exposure Hepatitis C One time testing for all patients 98 and older May be repeated more frequently for patients with increased risk factors or exposure Gonorrhea, Chlamydia Every 12 months for all sexually active persons 13-24 years Additional monitoring may be recommended for those who are considered high risk or who have symptoms PSA Men 28-73 years old with risk factors Additional screening may be recommended from age 2-69 based on risk factors, symptoms, and history  Vaccine Recommendations Tetanus Booster All adults every 10 years Flu Vaccine All patients 6 months and older every year COVID Vaccine All patients 12 years and older Initial dosing with booster May recommend additional booster based on age and health history HPV Vaccine 2 doses all patients age 92-26 Dosing may be considered for patients over 26 Shingles Vaccine (Shingrix) 2 doses all adults 74 years and older Pneumonia (Pneumovax 23) All adults 48 years and older May recommend earlier dosing based on health history Pneumonia (Prevnar 57) All adults 78 years and older Dosed 1 year after Pneumovax 23  Additional Screening, Testing, and Vaccinations may be recommended on an individualized basis based on family history, health history, risk factors, and/or exposure.  __________________________________________________________  Diet Recommendations for All Patients  I recommend that all patients maintain a diet low in saturated fats, carbohydrates, and cholesterol. While this can be challenging at first, it is not impossible and small changes can make big differences.  Things to try: Decreasing the amount of soda, sweet tea, and/or juice to one or less per day and replace with water While water is always the first choice, if you do not like water you may consider adding a water additive without sugar to improve the taste other sugar free drinks Replace potatoes with a brightly colored vegetable at dinner Use  healthy oils, such as canola oil or olive oil, instead of butter or hard margarine Limit your bread intake to two pieces or less a day Replace regular pasta with low carb pasta options Bake, broil, or grill foods instead of frying Monitor portion sizes  Eat smaller, more frequent meals throughout the day instead of large meals  An important thing to remember is, if you love foods that are not great for your health, you don't have to give them up completely. Instead, allow these foods to be a reward when you have done well. Allowing yourself to still have special treats every once in a while is a nice way to tell yourself thank you for working hard to keep yourself healthy.   Also remember that every day is a new day. If you have a bad day and "fall off the wagon", you can still climb right back up and keep moving along on your journey!  We have resources available to help you!  Some websites that may be helpful  include: www.http://carter.biz/  Www.VeryWellFit.com _____________________________________________________________  Activity Recommendations for All Patients  I recommend that all adults get at least 20 minutes of moderate physical activity that elevates your heart rate at least 5 days out of the week.  Some examples include: Walking or jogging at a pace that allows you to carry on a conversation Cycling (stationary bike or outdoors) Water aerobics Yoga Weight lifting Dancing If physical limitations prevent you from putting stress on your joints, exercise in a pool or seated in a chair are excellent options.  Do determine your MAXIMUM heart rate for activity: YOUR AGE - 220 = MAX HeartRate   Remember! Do not push yourself too hard.  Start slowly and build up your pace, speed, weight, time in exercise, etc.  Allow your body to rest between exercise and get good sleep. You will need more water than normal when you are exerting yourself. Do not wait until you are thirsty to drink.  Drink with a purpose of getting in at least 8, 8 ounce glasses of water a day plus more depending on how much you exercise and sweat.    If you begin to develop dizziness, chest pain, abdominal pain, jaw pain, shortness of breath, headache, vision changes, lightheadedness, or other concerning symptoms, stop the activity and allow your body to rest. If your symptoms are severe, seek emergency evaluation immediately. If your symptoms are concerning, but not severe, please let us know so that we can recommend further evaluation.

## 2021-08-25 NOTE — Progress Notes (Deleted)
No show

## 2021-09-15 ENCOUNTER — Encounter (HOSPITAL_BASED_OUTPATIENT_CLINIC_OR_DEPARTMENT_OTHER): Payer: Self-pay | Admitting: Nurse Practitioner

## 2021-10-13 ENCOUNTER — Other Ambulatory Visit: Payer: Self-pay

## 2021-10-15 ENCOUNTER — Other Ambulatory Visit: Payer: Self-pay

## 2021-10-15 ENCOUNTER — Ambulatory Visit
Admission: EM | Admit: 2021-10-15 | Discharge: 2021-10-15 | Disposition: A | Discharge status: Home / Self Care | Payer: Medicaid Other | Attending: Medical Oncology | Admitting: Medical Oncology

## 2021-10-15 DIAGNOSIS — N898 Other specified noninflammatory disorders of vagina: Secondary | ICD-10-CM | POA: Insufficient documentation

## 2021-10-15 LAB — WET PREP, GENITAL
Sperm: NONE SEEN
Trich, Wet Prep: NONE SEEN
WBC, Wet Prep HPF POC: <10 — AB (ref <10)
Yeast Wet Prep HPF POC: NONE SEEN

## 2021-10-15 MED ORDER — METRONIDAZOLE 500 MG PO TABS: 500.0000 mg | ORAL_TABLET | Freq: Two times a day (BID) | ORAL | 0 refills | Status: DC

## 2021-10-15 NOTE — ED Provider Notes (Signed)
[facility]    CSN: [id number] Arrival date & time: 10/15/21  1911      History   Chief Complaint Chief Complaint  Patient presents with   Vaginal Discharge    HPI Cynthia Moran is a 21 y.o. female.   HPI  Vaginal Discharge: Patient reports that for the past 2 days she has had thick white vaginal discharge with associated vaginal irritation, vaginal odor and discomfort with sexual intercourse. Similar to past BV infections. She reports no concern for STI given her monogamous status. Partner is asymptomatic. No fevers, pelvic pain, dysuria, abdominal pain. She has tried nothing yet for symptoms. LMP: 3 weeks ago.   History reviewed. No pertinent past medical history.  There are no problems to display for this patient.   Past Surgical History:  Procedure Laterality Date   CESAREAN SECTION     TONSILLECTOMY      OB History   No obstetric history on file.      Home Medications    Prior to Admission medications   Medication Sig Start Date End Date Taking? Authorizing Provider  norethindrone (MICRONOR) 0.35 MG tablet Take by mouth. 02/10/19  Yes [provider]  benzonatate (TESSALON) 100 MG capsule Take 1 capsule (100 mg total) by mouth every 8 (eight) hours. 11/01/20   Placido Sou, PA-C  fluticasone (FLONASE) 50 MCG/ACT nasal spray Place 2 sprays into both nostrils daily. 11/01/20   Placido Sou, PA-C    Family History Family History  Problem Relation Age of Onset   Healthy Mother    Healthy Father     Social History Social History   Tobacco Use   Smoking status: Never   Smokeless tobacco: Never  Vaping Use   Vaping Use: Some days   Substances: Nicotine  Substance Use Topics   Alcohol use: Yes    Comment: occaisonal    Drug use: Yes    Types: Marijuana     Allergies   Cat hair extract   Review of Systems Review of Systems  As stated above in HPI Physical Exam Triage Vital Signs ED Triage Vitals  Enc Vitals Group      BP 10/15/21 1921 113/69     Pulse Rate 10/15/21 1921 74     Resp 10/15/21 1921 16     Temp 10/15/21 1921 98.1 F (36.7 C)     Temp Source 10/15/21 1921 Oral     SpO2 10/15/21 1921 99 %     Weight 10/15/21 1924 125 lb (56.7 kg)     Height 10/15/21 1924 5\' 1"  (1.549 m)     Head Circumference --      Peak Flow --      Pain Score 10/15/21 1924 0     Pain Loc --      Pain Edu? --      Excl. in GC? --    No data found.  Updated Vital Signs BP 113/69 (BP Location: Left Arm)    Pulse 74    Temp 98.1 F (36.7 C) (Oral)    Resp 16    Ht 5\' 1"  (1.549 m)    Wt 125 lb (56.7 kg)    LMP 09/22/2021 (Approximate)    SpO2 99%    BMI 23.62 kg/m   Physical Exam Vitals and nursing note reviewed.  Constitutional:      Appearance: Normal appearance.  Cardiovascular:     Rate and Rhythm: Normal rate and regular rhythm.     Heart sounds:  Normal heart sounds.  Pulmonary:     Effort: Pulmonary effort is normal.     Breath sounds: Normal breath sounds.  Abdominal:     Palpations: Abdomen is soft.     Tenderness: There is no abdominal tenderness. There is no guarding.  Skin:    General: Skin is warm.  Neurological:     Mental Status: She is alert and oriented to person, place, and time.     UC Treatments / Results  Labs (all labs ordered are listed, but only abnormal results are displayed) Labs Reviewed - No data to display  EKG   Radiology No results found.  Procedures Procedures (including critical care time)  Medications Ordered in UC Medications - No data to display  Initial Impression / Assessment and Plan / UC Course  I have reviewed the triage vital signs and the nursing notes.  Pertinent labs & imaging results that were available during my care of the patient were reviewed by me and considered in my medical decision making (see chart for details).     New. Treating for bacterial vaginosis. Discussed flagyl along with how to take with risks and benefits. Follow up  PRN.  Final Clinical Impressions(s) / UC Diagnoses   Final diagnoses:  None   Discharge Instructions   None    ED Prescriptions   None    PDMP not reviewed this encounter.   Rushie Chestnut, [place] 10/15/21 2017

## 2021-10-15 NOTE — ED Triage Notes (Signed)
Pt reports thick, white vaginal discharge for the past 2 days, also reports irritation, foul discharge, discomfort with sex.   Reports same sex partner, unprotected intercourse, reports sexual partner w/o symptoms  Hx of BV

## 2021-10-17 LAB — CERVICOVAGINAL ANCILLARY ONLY
Chlamydia: NEGATIVE
Comment: NEGATIVE
Comment: NORMAL
Neisseria Gonorrhea: NEGATIVE

## 2021-10-28 ENCOUNTER — Ambulatory Visit
Admission: EM | Admit: 2021-10-28 | Discharge: 2021-10-28 | Disposition: A | Discharge status: Home / Self Care | Payer: Medicaid Other | Attending: Internal Medicine | Admitting: Internal Medicine

## 2021-10-28 ENCOUNTER — Other Ambulatory Visit: Payer: Self-pay

## 2021-10-28 DIAGNOSIS — R0981 Nasal congestion: Secondary | ICD-10-CM | POA: Diagnosis not present

## 2021-10-28 DIAGNOSIS — R059 Cough, unspecified: Secondary | ICD-10-CM | POA: Insufficient documentation

## 2021-10-28 DIAGNOSIS — J111 Influenza due to unidentified influenza virus with other respiratory manifestations: Secondary | ICD-10-CM

## 2021-10-28 DIAGNOSIS — R1110 Vomiting, unspecified: Secondary | ICD-10-CM | POA: Diagnosis not present

## 2021-10-28 DIAGNOSIS — Z20822 Contact with and (suspected) exposure to covid-19: Secondary | ICD-10-CM | POA: Insufficient documentation

## 2021-10-28 DIAGNOSIS — F17290 Nicotine dependence, other tobacco product, uncomplicated: Secondary | ICD-10-CM | POA: Diagnosis not present

## 2021-10-28 LAB — RESP PANEL BY RT-PCR (FLU A&B, COVID) ARPGX2
Influenza A by PCR: NEGATIVE
Influenza B by PCR: NEGATIVE
SARS Coronavirus 2 by RT PCR: NEGATIVE

## 2021-10-28 NOTE — Discharge Instructions (Addendum)
No danger signs on exam today.  Symptoms seem most consistent with a viral respiratory infection.  Tests for covid and flu A/flu B were negative today.  Push fluids and rest.  Take tylenol or advil otc as needed for fever, discomfort.  Eat fruits and vegetables to help your immune system do its best work.  Anticipate gradual improvement over the next several days.  Recheck for new fever >100.5, increasing phlegm production/nasal discharge, or if not starting to improve in a few days.

## 2021-10-28 NOTE — ED Triage Notes (Signed)
Patient presents to Urgent Care with complaints of cough and nasal congestion since 3 days ago. She states she tested negative for covid yesterday. She is concerned with sinus infection.   Denies fever.

## 2021-10-28 NOTE — ED Provider Notes (Signed)
[facility]    CSN: [id number] Arrival date & time: 10/28/21  7867      History   Chief Complaint Chief Complaint  Patient presents with   Cough   Nasal Congestion    HPI Cynthia Moran is a 21 y.o. female.  She presents with 3-day history of cough, nasal congestion, some malaise.  No fever.  Has had some posttussive emesis.  Home test for COVID was negative yesterday.  HPI  History reviewed. No pertinent past medical history.  There are no problems to display for this patient.   Past Surgical History:  Procedure Laterality Date   CESAREAN SECTION     TONSILLECTOMY         Home Medications    Prior to Admission medications   Medication Sig Start Date End Date Taking? Authorizing Provider  metroNIDAZOLE (FLAGYL) 500 MG tablet Take 1 tablet (500 mg total) by mouth 2 (two) times daily. 10/15/21   Rushie Chestnut, PA-C  norethindrone (MICRONOR) 0.35 MG tablet Take by mouth. 02/10/19   [provider]    Family History Family History  Problem Relation Age of Onset   Healthy Mother    Healthy Father     Social History Social History   Tobacco Use   Smoking status: Never   Smokeless tobacco: Never  Vaping Use   Vaping Use: Some days   Substances: Nicotine  Substance Use Topics   Alcohol use: Yes    Comment: occaisonal    Drug use: Yes    Types: Marijuana     Allergies   Cat hair extract   Review of Systems Review of Systems see HPI   Physical Exam Triage Vital Signs ED Triage Vitals  Enc Vitals Group     BP 10/28/21 0839 115/69     Pulse Rate 10/28/21 0839 71     Resp 10/28/21 0839 16     Temp 10/28/21 0839 98.2 F (36.8 C)     Temp Source 10/28/21 0839 Oral     SpO2 10/28/21 0839 98 %     Weight --      Height --      Pain Score 10/28/21 0838 0     Pain Loc --    Updated Vital Signs BP 115/69 (BP Location: Right Arm)    Pulse 71    Temp 98.2 F (36.8 C) (Oral)    Resp 16    LMP 10/27/2021    SpO2 98%    Physical Exam Constitutional:      General: She is not in acute distress.    Appearance: She is not ill-appearing or toxic-appearing.     Comments: Nicely groomed  HENT:     Head: Atraumatic.     Comments: Bilateral TMs are translucent, no erythema Moderate nasal congestion bilaterally Posterior pharynx is may be slightly injected, may be some clear postnasal discharge Eyes:     Conjunctiva/sclera:     Right eye: Right conjunctiva is not injected. No exudate.    Left eye: Left conjunctiva is not injected. No exudate.    Comments: Conjugate gaze observed  Cardiovascular:     Rate and Rhythm: Normal rate and regular rhythm.  Pulmonary:     Effort: Pulmonary effort is normal. No respiratory distress.     Breath sounds: No wheezing or rhonchi.  Abdominal:     General: There is no distension.  Musculoskeletal:     Cervical back: Neck supple.     Comments: Walked into  the urgent care independently  Skin:    General: Skin is warm and dry.     Comments: Pink, no cyanosis  Neurological:     Mental Status: She is alert.     Comments: Face symmetric, speech clear, coherent, logical     UC Treatments / Results  Labs (all labs ordered are listed, but only abnormal results are displayed) Labs Reviewed  RESP PANEL BY RT-PCR (FLU A&B, COVID) [id number]  Tests are negative for covid, flu A & flu B  EKG N/A  Radiology No results found. N/A  Procedures Procedures (including critical care time) N/A  Medications Ordered in UC Medications - No data to display N/A  Final Clinical Impressions(s) / UC Diagnoses   Final diagnoses:  Influenza-like illness     Discharge Instructions      No danger signs on exam today.  Symptoms seem most consistent with a viral respiratory infection.  Tests for covid and flu A/flu B were negative today.  Push fluids and rest.  Take tylenol or advil otc as needed for fever, discomfort.  Eat fruits and vegetables to help your immune system do its  best work.  Anticipate gradual improvement over the next several days.  Recheck for new fever >100.5, increasing phlegm production/nasal discharge, or if not starting to improve in a few days.      ED Prescriptions   None    PDMP not reviewed this encounter.   Wynona Luna, MD 10/29/21 [phone]

## 2022-01-05 DIAGNOSIS — F419 Anxiety disorder, unspecified: Secondary | ICD-10-CM | POA: Diagnosis not present

## 2022-02-11 ENCOUNTER — Ambulatory Visit: Payer: Medicaid Other | Admitting: Family

## 2022-02-16 DIAGNOSIS — F419 Anxiety disorder, unspecified: Secondary | ICD-10-CM | POA: Diagnosis not present

## 2022-02-19 ENCOUNTER — Other Ambulatory Visit: Payer: Self-pay

## 2022-02-19 ENCOUNTER — Emergency Department (HOSPITAL_COMMUNITY)
Admission: EM | Admit: 2022-02-19 | Discharge: 2022-02-20 | Disposition: A | Discharge status: Home / Self Care | Payer: Medicaid Other | Attending: Emergency Medicine | Admitting: Emergency Medicine

## 2022-02-19 ENCOUNTER — Encounter (HOSPITAL_COMMUNITY): Payer: Self-pay | Admitting: Emergency Medicine

## 2022-02-19 DIAGNOSIS — S022XXA Fracture of nasal bones, initial encounter for closed fracture: Secondary | ICD-10-CM | POA: Diagnosis not present

## 2022-02-19 DIAGNOSIS — W500XXA Accidental hit or strike by another person, initial encounter: Secondary | ICD-10-CM | POA: Insufficient documentation

## 2022-02-19 DIAGNOSIS — S0993XA Unspecified injury of face, initial encounter: Secondary | ICD-10-CM | POA: Diagnosis present
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
## 2022-02-20 ENCOUNTER — Emergency Department (HOSPITAL_COMMUNITY): Payer: Medicaid Other

## 2022-02-20 DIAGNOSIS — S022XXA Fracture of nasal bones, initial encounter for closed fracture: Secondary | ICD-10-CM | POA: Diagnosis not present

## 2022-02-20 MED ORDER — IBUPROFEN 400 MG PO TABS
600.0000 mg | ORAL_TABLET | Freq: Once | ORAL | Status: AC
  Administered 2022-02-20: 600 mg via ORAL
  Filled 2022-02-20: qty 1

## 2022-02-20 NOTE — Discharge Instructions (Addendum)
You have fractures of bilateral nasal bones.  To help with pain and swelling take ibuprofen and Tylenol every 6 hours as needed and you can apply ice as well.  Avoid nose blowing.   ? ?You will need to follow-up with [facility] for further management of nasal fracture.  Please call to schedule follow-up appointment.  They typically like to wait several days to allow swelling to go down before seeing patients in follow-up. ? ? ?
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
## 2022-02-23 ENCOUNTER — Ambulatory Visit: Payer: Medicaid Other | Admitting: Nurse Practitioner

## 2022-02-24 DIAGNOSIS — S022XXA Fracture of nasal bones, initial encounter for closed fracture: Secondary | ICD-10-CM | POA: Diagnosis not present

## 2022-02-24 DIAGNOSIS — J342 Deviated nasal septum: Secondary | ICD-10-CM | POA: Diagnosis not present

## 2022-02-24 DIAGNOSIS — J343 Hypertrophy of nasal turbinates: Secondary | ICD-10-CM | POA: Diagnosis not present

## 2022-02-24 DIAGNOSIS — Z87891 Personal history of nicotine dependence: Secondary | ICD-10-CM | POA: Diagnosis not present

## 2022-02-25 ENCOUNTER — Ambulatory Visit (INDEPENDENT_AMBULATORY_CARE_PROVIDER_SITE_OTHER): Payer: Medicaid Other | Admitting: Nurse Practitioner

## 2022-02-25 ENCOUNTER — Encounter: Payer: Self-pay | Admitting: Nurse Practitioner

## 2022-02-25 ENCOUNTER — Encounter (HOSPITAL_COMMUNITY): Payer: Self-pay | Admitting: Otolaryngology

## 2022-02-25 ENCOUNTER — Other Ambulatory Visit: Payer: Self-pay | Admitting: Otolaryngology

## 2022-02-25 VITALS — BP 93/57 | HR 67 | Temp 98.1°F | Ht 62.0 in | Wt 123.4 lb

## 2022-02-25 DIAGNOSIS — J301 Allergic rhinitis due to pollen: Secondary | ICD-10-CM

## 2022-02-25 DIAGNOSIS — Z7689 Persons encountering health services in other specified circumstances: Secondary | ICD-10-CM | POA: Diagnosis not present

## 2022-02-25 DIAGNOSIS — B9689 Other specified bacterial agents as the cause of diseases classified elsewhere: Secondary | ICD-10-CM

## 2022-02-25 DIAGNOSIS — S022XXD Fracture of nasal bones, subsequent encounter for fracture with routine healing: Secondary | ICD-10-CM

## 2022-02-25 DIAGNOSIS — S022XXA Fracture of nasal bones, initial encounter for closed fracture: Secondary | ICD-10-CM | POA: Insufficient documentation

## 2022-02-25 DIAGNOSIS — N760 Acute vaginitis: Secondary | ICD-10-CM

## 2022-02-25 MED ORDER — METRONIDAZOLE 0.75 % VA GEL: 1.0000 | Freq: Every day | VAGINAL | 1 refills | Status: DC
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
## 2022-02-25 NOTE — Progress Notes (Signed)
TWO VISITORS ARE ALLOWED TO COME WITH YOU AND STAY IN THE SURGICAL WAITING ROOM ONLY DURING PRE OP AND PROCEDURE DAY OF SURGERY.  ? ?PCP - Vincent Gros, NP ?Cardiologist - n/a ? ?Chest x-ray - n/a ?EKG - n/a ?Stress Test - n/a ?ECHO - n/a ?Cardiac Cath - n/a ? ?ICD Pacemaker/Loop - n/a ? ?Sleep Study -  n/a ?CPAP - none ? ?ERAS: Clear liquids til 10:30 AM DOS ? ?STOP now taking any Aspirin (unless otherwise instructed by your surgeon), Aleve, Naproxen, Ibuprofen, Motrin, Advil, Goody's, BC's, all herbal medications, fish oil, and all vitamins.  ? ?Coronavirus Screening ?Do you have any of the following symptoms:  ?Cough yes/no: No ?Fever (>100.60F)  yes/no: No ?Runny nose Yes- allergies ?Sore throat yes/no: No ?Difficulty breathing/shortness of breath  yes/no: No ? ?Have you traveled in the last 14 days and where? yes/no: No ? ?Patient verbalized understanding of instructions that were given via phone. ?

## 2022-02-26 NOTE — Progress Notes (Signed)
Pt made aware of surgery time change. Arrival 0940, surgery 1210. Stop eating solid food by midnight, and stop drinking clear liquids by 0910, no milk or dairy products.

## 2022-02-27 ENCOUNTER — Ambulatory Visit (HOSPITAL_BASED_OUTPATIENT_CLINIC_OR_DEPARTMENT_OTHER): Payer: Medicaid Other

## 2022-02-27 ENCOUNTER — Encounter (HOSPITAL_COMMUNITY): Payer: Self-pay | Admitting: Otolaryngology

## 2022-02-27 ENCOUNTER — Other Ambulatory Visit: Payer: Self-pay

## 2022-02-27 ENCOUNTER — Ambulatory Visit (HOSPITAL_COMMUNITY)
Admission: RE | Admit: 2022-02-27 | Discharge: 2022-02-27 | Disposition: A | Discharge status: Home / Self Care | Payer: Medicaid Other | Attending: Otolaryngology | Admitting: Otolaryngology

## 2022-02-27 ENCOUNTER — Other Ambulatory Visit (HOSPITAL_COMMUNITY): Payer: Self-pay

## 2022-02-27 ENCOUNTER — Ambulatory Visit (HOSPITAL_COMMUNITY): Payer: Medicaid Other

## 2022-02-27 ENCOUNTER — Encounter (HOSPITAL_COMMUNITY)
Admission: RE | Disposition: A | Discharge status: Home / Self Care | Payer: Self-pay | Source: Home / Self Care | Attending: Otolaryngology

## 2022-02-27 DIAGNOSIS — S022XXA Fracture of nasal bones, initial encounter for closed fracture: Secondary | ICD-10-CM

## 2022-02-27 DIAGNOSIS — F319 Bipolar disorder, unspecified: Secondary | ICD-10-CM | POA: Diagnosis not present

## 2022-02-27 DIAGNOSIS — F1290 Cannabis use, unspecified, uncomplicated: Secondary | ICD-10-CM | POA: Diagnosis not present

## 2022-02-27 DIAGNOSIS — F901 Attention-deficit hyperactivity disorder, predominantly hyperactive type: Secondary | ICD-10-CM | POA: Diagnosis not present

## 2022-02-27 DIAGNOSIS — X58XXXA Exposure to other specified factors, initial encounter: Secondary | ICD-10-CM | POA: Insufficient documentation

## 2022-02-27 DIAGNOSIS — Z01818 Encounter for other preprocedural examination: Secondary | ICD-10-CM

## 2022-02-27 DIAGNOSIS — F419 Anxiety disorder, unspecified: Secondary | ICD-10-CM | POA: Diagnosis not present

## 2022-02-27 HISTORY — DX: Anxiety disorder, unspecified: F41.9

## 2022-02-27 HISTORY — PX: CLOSED REDUCTION NASAL FRACTURE: SHX5365

## 2022-02-27 HISTORY — DX: Depression, unspecified: F32.A

## 2022-02-27 HISTORY — DX: Bipolar disorder, unspecified: F31.9

## 2022-02-27 HISTORY — DX: Attention-deficit hyperactivity disorder, unspecified type: F90.9

## 2022-02-27 LAB — CBC
HCT: 37.8 % (ref 36.0–46.0)
Hemoglobin: 12.7 g/dL (ref 12.0–15.0)
MCH: 30.4 pg (ref 26.0–34.0)
MCHC: 33.6 g/dL (ref 30.0–36.0)
MCV: 90.4 fL (ref 80.0–100.0)
Platelets: 252 10*3/uL (ref 150–400)
RBC: 4.18 MIL/uL (ref 3.87–5.11)
RDW: 12.8 % (ref 11.5–15.5)
WBC: 7.9 10*3/uL (ref 4.0–10.5)
nRBC: 0 % (ref 0.0–0.2)

## 2022-02-27 LAB — COMPREHENSIVE METABOLIC PANEL
ALT: 14 U/L (ref 0–44)
AST: 16 U/L (ref 15–41)
Albumin: 3.7 g/dL (ref 3.5–5.0)
Alkaline Phosphatase: 39 U/L (ref 38–126)
Anion gap: 7 (ref 5–15)
BUN: 10 mg/dL (ref 6–20)
CO2: 25 mmol/L (ref 22–32)
Calcium: 9 mg/dL (ref 8.9–10.3)
Chloride: 106 mmol/L (ref 98–111)
Creatinine, Ser: 0.81 mg/dL (ref 0.44–1.00)
GFR, Estimated: >60 mL/min (ref >60)
Glucose, Bld: 85 mg/dL (ref 70–99)
Potassium: 3.9 mmol/L (ref 3.5–5.1)
Sodium: 138 mmol/L (ref 135–145)
Total Bilirubin: 0.5 mg/dL (ref 0.3–1.2)
Total Protein: 6.4 g/dL — ABNORMAL LOW (ref 6.5–8.1)

## 2022-02-27 LAB — POCT PREGNANCY, URINE: Preg Test, Ur: NEGATIVE

## 2022-02-27 LAB — SURGICAL PCR SCREEN
MRSA, PCR: NEGATIVE
Staphylococcus aureus: POSITIVE — AB

## 2022-02-27 SURGERY — CLOSED REDUCTION, FRACTURE, NASAL BONE
Anesthesia: General | Laterality: Bilateral

## 2022-02-27 MED ORDER — FENTANYL CITRATE (PF) 250 MCG/5ML IJ SOLN
INTRAMUSCULAR | Status: AC
Start: 2022-02-27 — End: ?
  Filled 2022-02-27: qty 5

## 2022-02-27 MED ORDER — LIDOCAINE-EPINEPHRINE 1 %-1:100000 IJ SOLN
INTRAMUSCULAR | Status: AC
  Filled 2022-02-27: qty 1

## 2022-02-27 MED ORDER — FENTANYL CITRATE (PF) 100 MCG/2ML IJ SOLN: 25.0000 ug | INTRAMUSCULAR | Status: DC | PRN

## 2022-02-27 MED ORDER — DEXAMETHASONE SODIUM PHOSPHATE 10 MG/ML IJ SOLN
INTRAMUSCULAR | Status: DC | PRN
  Administered 2022-02-27: 10 mg via INTRAVENOUS

## 2022-02-27 MED ORDER — LACTATED RINGERS IV SOLN: INTRAVENOUS | Status: DC

## 2022-02-27 MED ORDER — MIDAZOLAM HCL 2 MG/2ML IJ SOLN
INTRAMUSCULAR | Status: DC | PRN
Start: 2022-02-27 — End: 2022-02-27
  Administered 2022-02-27: 2 mg via INTRAVENOUS

## 2022-02-27 MED ORDER — LIDOCAINE 2% (20 MG/ML) 5 ML SYRINGE
INTRAMUSCULAR | Status: DC | PRN
  Administered 2022-02-27: 40 mg via INTRAVENOUS

## 2022-02-27 MED ORDER — LIDOCAINE 2% (20 MG/ML) 5 ML SYRINGE
INTRAMUSCULAR | Status: AC
  Filled 2022-02-27: qty 5

## 2022-02-27 MED ORDER — PROPOFOL 10 MG/ML IV BOLUS
INTRAVENOUS | Status: DC | PRN
  Administered 2022-02-27: 120 mg via INTRAVENOUS

## 2022-02-27 MED ORDER — ACETAMINOPHEN 500 MG PO TABS
1000.0000 mg | ORAL_TABLET | Freq: Once | ORAL | Status: AC
  Administered 2022-02-27: 1000 mg via ORAL
  Filled 2022-02-27: qty 2

## 2022-02-27 MED ORDER — OXYMETAZOLINE HCL 0.05 % NA SOLN
NASAL | Status: AC
  Filled 2022-02-27: qty 30

## 2022-02-27 MED ORDER — CEFAZOLIN SODIUM-DEXTROSE 2-4 GM/100ML-% IV SOLN
2.0000 g | INTRAVENOUS | Status: AC
  Administered 2022-02-27: 2 g via INTRAVENOUS
  Filled 2022-02-27: qty 100

## 2022-02-27 MED ORDER — ORAL CARE MOUTH RINSE: 15.0000 mL | Freq: Once | OROMUCOSAL | Status: AC

## 2022-02-27 MED ORDER — MUPIROCIN 2 % EX OINT
TOPICAL_OINTMENT | CUTANEOUS | Status: AC
  Filled 2022-02-27: qty 22

## 2022-02-27 MED ORDER — PROPOFOL 10 MG/ML IV BOLUS
INTRAVENOUS | Status: AC
  Filled 2022-02-27: qty 20

## 2022-02-27 MED ORDER — ROCURONIUM BROMIDE 10 MG/ML (PF) SYRINGE
PREFILLED_SYRINGE | INTRAVENOUS | Status: AC
  Filled 2022-02-27: qty 10

## 2022-02-27 MED ORDER — DOCUSATE SODIUM 100 MG PO CAPS
100.0000 mg | ORAL_CAPSULE | Freq: Two times a day (BID) | ORAL | 0 refills | Status: AC | PRN
  Filled 2022-02-27: qty 10, 5d supply, fill #0

## 2022-02-27 MED ORDER — MIDAZOLAM HCL 2 MG/2ML IJ SOLN
INTRAMUSCULAR | Status: AC
  Filled 2022-02-27: qty 2

## 2022-02-27 MED ORDER — HYDROCODONE-ACETAMINOPHEN 5-325 MG PO TABS
1.0000 | ORAL_TABLET | Freq: Three times a day (TID) | ORAL | 0 refills | Status: AC | PRN
  Filled 2022-02-27: qty 15, 5d supply, fill #0

## 2022-02-27 MED ORDER — ONDANSETRON HCL 4 MG/2ML IJ SOLN
INTRAMUSCULAR | Status: DC | PRN
  Administered 2022-02-27: 4 mg via INTRAVENOUS

## 2022-02-27 MED ORDER — OXYMETAZOLINE HCL 0.05 % NA SOLN
NASAL | Status: DC | PRN
Start: 2022-02-27 — End: 2022-02-27
  Administered 2022-02-27: 1 via TOPICAL

## 2022-02-27 MED ORDER — FENTANYL CITRATE (PF) 250 MCG/5ML IJ SOLN
INTRAMUSCULAR | Status: DC | PRN
  Administered 2022-02-27: 100 ug via INTRAVENOUS

## 2022-02-27 MED ORDER — ROCURONIUM BROMIDE 10 MG/ML (PF) SYRINGE
PREFILLED_SYRINGE | INTRAVENOUS | Status: DC | PRN
  Administered 2022-02-27: 50 mg via INTRAVENOUS

## 2022-02-27 MED ORDER — LIDOCAINE-EPINEPHRINE 1 %-1:100000 IJ SOLN
INTRAMUSCULAR | Status: DC | PRN
  Administered 2022-02-27: 8 mL

## 2022-02-27 MED ORDER — SUGAMMADEX SODIUM 200 MG/2ML IV SOLN
INTRAVENOUS | Status: DC | PRN
Start: 2022-02-27 — End: 2022-02-27
  Administered 2022-02-27: 150 mg via INTRAVENOUS

## 2022-02-27 MED ORDER — CHLORHEXIDINE GLUCONATE 0.12 % MT SOLN
15.0000 mL | Freq: Once | OROMUCOSAL | Status: AC
  Administered 2022-02-27: 15 mL via OROMUCOSAL
  Filled 2022-02-27: qty 15

## 2022-02-27 SURGICAL SUPPLY — 29 items
APL SKNCLS STERI-STRIP NONHPOA (GAUZE/BANDAGES/DRESSINGS) ×1
BAG COUNTER SPONGE SURGICOUNT (BAG) ×2 IMPLANT
BAG SPNG CNTER NS LX DISP (BAG) ×1
BENZOIN TINCTURE PRP APPL 2/3 (GAUZE/BANDAGES/DRESSINGS) ×2 IMPLANT
BLADE SURG 15 STRL LF DISP TIS (BLADE) IMPLANT
BLADE SURG 15 STRL SS (BLADE)
CANISTER SUCT 3000ML PPV (MISCELLANEOUS) ×2 IMPLANT
COVER BACK TABLE 60X90IN (DRAPES) ×2 IMPLANT
DRAPE HALF SHEET 40X57 (DRAPES) IMPLANT
GAUZE 4X4 16PLY ~~LOC~~+RFID DBL (SPONGE) ×2 IMPLANT
GAUZE SPONGE 2X2 8PLY STRL LF (GAUZE/BANDAGES/DRESSINGS) ×1 IMPLANT
GLOVE SURG ENC MOIS LTX SZ6.5 (GLOVE) ×2 IMPLANT
GOWN STRL REUS W/ TWL LRG LVL3 (GOWN DISPOSABLE) ×2 IMPLANT
GOWN STRL REUS W/TWL LRG LVL3 (GOWN DISPOSABLE) ×4
KIT BASIN OR (CUSTOM PROCEDURE TRAY) ×2 IMPLANT
KIT TURNOVER KIT B (KITS) ×2 IMPLANT
NDL HYPO 25GX1X1/2 BEV (NEEDLE) ×1 IMPLANT
NEEDLE HYPO 25GX1X1/2 BEV (NEEDLE) ×2 IMPLANT
NS IRRIG 1000ML POUR BTL (IV SOLUTION) ×2 IMPLANT
PAD ARMBOARD 7.5X6 YLW CONV (MISCELLANEOUS) ×4 IMPLANT
PATTIES SURGICAL .5 X3 (DISPOSABLE) ×2 IMPLANT
POSITIONER HEAD DONUT 9IN (MISCELLANEOUS) ×2 IMPLANT
SPLINT NASAL THERMO PLAST (MISCELLANEOUS) ×2 IMPLANT
SPONGE GAUZE 2X2 STER 10/PKG (GAUZE/BANDAGES/DRESSINGS) ×1
STRIP CLOSURE SKIN 1/2X4 (GAUZE/BANDAGES/DRESSINGS) ×2 IMPLANT
SUT CHROMIC 2 0 SH (SUTURE) IMPLANT
SYR CONTROL 10ML LL (SYRINGE) ×2 IMPLANT
TOWEL GREEN STERILE FF (TOWEL DISPOSABLE) ×2 IMPLANT
TUBE CONNECTING 12X1/4 (SUCTIONS) ×3 IMPLANT

## 2022-02-27 NOTE — Op Note (Signed)
OPERATIVE NOTE  Cynthia Moran Date/Time of Admission: 02/27/2022  9:41 AM  CSN: [id number]:[id number] Attending Provider: Cheron Schaumann A, DO Room/Bed: MCPO/NONE DOB: [birth year] Age: 21 y.o.   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 General anesthesia was established without difficulty. When the patient was adequately anesthetized, surgical timeout was performed and correct identification of the patient and the surgical procedure was achieved. The patient's nose was then injected with 8cc of 1% lidocaine with 1:100,000 dilution epinephrine which was injected in a submucosal fashion along the nasal dorsum. The patient's nose was then packed with Afrin-soaked cottonoid pledgets which were left in place for approximately 10 minutes to allow for vasoconstriction and hemostasis. The patient was then positioned, prepped and draped in sterile fashion.  The patient's nasal cavity was inspected, and a nasal dorsal right deviation with depressed nasal fracture on the left was noted.  Closed reduction was undertaken beginning with elevation of the depressed nasal fracture using a Clinical biochemist.  Using external digital pressure, the nasal fracture was reduced and the nasal dorsum was brought to a good midline position.   There was no significant bleeding and no evidence of intranasal laceration.  An external nasal dorsal splint was then placed, this consisted of benzoin, quarter-inch paper tape and Aquaplast external nasal splint.  The patient was awakened from their anesthetic and transferred from the operating room to the recovery room in stable condition.    Laren Boom, DO [facility]  02/27/2022

## 2022-02-27 NOTE — Transfer of Care (Signed)
Immediate Anesthesia Transfer of Care Note  Patient: Cynthia Moran  Procedure(s) Performed: CLOSED REDUCTION NASAL FRACTURE (Bilateral)  Patient Location: PACU  Anesthesia Type:General  Level of Consciousness: awake, alert  and oriented  Airway & Oxygen Therapy: Patient Spontanous Breathing  Post-op Assessment: Report given to RN and Post -op Vital signs reviewed and stable  Post vital signs: Reviewed and stable  Last Vitals:  Vitals Value Taken Time  BP 124/88 02/27/22 1225  Temp 36.6 C 02/27/22 1225  Pulse 102 02/27/22 1228  Resp 14 02/27/22 1228  SpO2 100 % 02/27/22 1228  Vitals shown include unvalidated device data.  Last Pain:  Vitals:   02/27/22 1225  TempSrc:   PainSc: 0-No pain         Complications: No notable events documented.

## 2022-02-27 NOTE — Anesthesia Procedure Notes (Addendum)
Procedure Name: Intubation Date/Time: 02/27/2022 11:33 AM Performed by: Imagene Riches, CRNA Pre-anesthesia Checklist: Patient identified, Emergency Drugs available, Suction available and Patient being monitored Patient Re-evaluated:Patient Re-evaluated prior to induction Oxygen Delivery Method: Circle system utilized Preoxygenation: Pre-oxygenation with 100% oxygen Induction Type: IV induction Ventilation: Mask ventilation without difficulty Laryngoscope Size: Mac and 3 Grade View: Grade I Tube type: Oral Tube size: 7.0 mm Number of attempts: 1 Airway Equipment and Method: Stylet Placement Confirmation: ETT inserted through vocal cords under direct vision, positive ETCO2 and breath sounds checked- equal and bilateral Secured at: 21 cm Tube secured with: Tape Dental Injury: Teeth and Oropharynx as per pre-operative assessment

## 2022-02-27 NOTE — Discharge Instructions (Signed)
Post-operative Instructions Following  Closed Reduction of Nasal Fracture  General: Closed reduction of nasal fracture is performed as an outpatient procedure under local anesthesia,  sedation or general anesthesia depending on the type of fracture and age of the patient. If you have other  medical conditions such as sleep apnea, you may spend one night in the hospital after your procedure.  You will wear an external nasal cast for approximately one week following the procedure. On occasion,  nasal sponge packing is placed to minimize post-operative bleeding. The nose may be congested or  obstructed in the first few to several days following the procedure. This is relieved with saline spray and  decongestant spray as directed by your doctor (see Nasal Care following the Surgery below). Mild to  moderate nasal discomfort, bruising under the eyes (black eyes) and oozing of blood from the nose is  expected in the first 48 hours.   Diet: You may have liquids by mouth once you have awakened from anesthesia. If you tolerate the liquids  without significant nausea or vomiting then you may take solid foods without restrictions. If nausea is  persistent, an anti-emetic medication may be prescribed for you. Some patients experience a mild sore  throat for 2-3 days following the procedure. This usually does not interfere with swallowing.  Pain control: Patients report mild to moderate nasal pain, congestion and headache for a few to several days following  septoplasty. This is usually well controlled with prescription strength oral pain medications (Vicodin,  Tylenol #3, Ultracet). Please take the pain medication prescribed by your surgeon when needed. You  may instead use over-the-counter non-steroidal anti-inflammatory drugs (NSAIDS) such as ibuprofen or  naproxen (Motrin, Naprosyn, Advil) if your doctor has advised you these drugs are appropriate for your  care. Please contact our office if your  pain is not controlled with your prescription pain  medication or if you have any questions regarding your post-operative pain control.  Activity: No heavy lifting, straining or strenuous exercise for 1 week following the surgery. You should plan for  1 week away from work. If your job requires manual labor, lifting or straining then you should be out of  work for 1 week or limited to light duty until the 1 week post-op mark. Walking and other light  activities are encouraged after the first 24 hours.   *If you are an athlete involved in contact sports (basketball, football, soccer, wrestling, etc.) your doctor  will discuss specific return to play instructions with you after your procedure. Age, level of competition  and the severity of the fracture all determine when you can return to competition. A protective mask or  nasal cast may be prescribed.  Nasal care following the surgery: Spray the nose at least 4 times daily with saline solution beginning the evening of surgery. This can be  accomplished with an Ocean Saline Spray or Simply Saline bottle (available over the counter at [employer]). Also, spray the nose with nasal decongestant (such as Afrin or Neo-Synephrine) two  sprays to each nostril twice daily for two days following the procedure. Hot steam showers as needed  are very helpful in relieving nasal congestion and crusting or scabbing in the nose. Sleep with the head  elevated for the first 48 hours; this will minimize pain and congestion. You may use two pillows to do  this or sleep in a reclining chair. You may get the nasal cast wet in the shower 48 hours after your  procedure. You may let the cast air dry and dab the area around it with a towel.  Follow-up appointment: Your follow up appointment in the office will be 5-8 days following your procedure. This visit should  be scheduled prior to your surgery (at the time of your pre-operative visit). The nasal cast will  be  removed at this visit. If you do not have the appointment made, please contact our office when you  arrive home from the surgery center.   Please call our office immediately if you experience: *Brisk nose bleeding *Fever greater than 101 degrees Fahrenheit *Purulent discharge (pus) coming from the nose *Severe nasal pain or headache  Call 911 for severe bleeding or difficulty breathing
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
## 2022-02-27 NOTE — Anesthesia Preprocedure Evaluation (Addendum)
Anesthesia Evaluation  Patient identified by MRN, date of birth, ID band Patient awake    Reviewed: Allergy & Precautions, NPO status , Patient's Chart, lab work & pertinent test results  Airway Mallampati: I  TM Distance: >3 FB Neck ROM: Full    Dental no notable dental hx. (+) Teeth Intact, Dental Advisory Given   Pulmonary neg pulmonary ROS,    Pulmonary exam normal breath sounds clear to auscultation       Cardiovascular negative cardio ROS Normal cardiovascular exam Rhythm:Regular Rate:Normal     Neuro/Psych PSYCHIATRIC DISORDERS Anxiety Depression Bipolar Disorder negative neurological ROS     GI/Hepatic negative GI ROS, (+)     substance abuse  marijuana use,   Endo/Other  negative endocrine ROS  Renal/GU negative Renal ROS  negative genitourinary   Musculoskeletal negative musculoskeletal ROS (+)   Abdominal   Peds  (+) ADHD Hematology negative hematology ROS (+)   Anesthesia Other Findings   Reproductive/Obstetrics                           Anesthesia Physical Anesthesia Plan  ASA: 2  Anesthesia Plan: General   Post-op Pain Management: Tylenol PO (pre-op)*   Induction: Intravenous  PONV Risk Score and Plan: 3 and Ondansetron, Dexamethasone and Midazolam  Airway Management Planned: Oral ETT  Additional Equipment:   Intra-op Plan:   Post-operative Plan: Extubation in OR  Informed Consent: I have reviewed the patients History and Physical, chart, labs and discussed the procedure including the risks, benefits and alternatives for the proposed anesthesia with the patient or authorized representative who has indicated his/her understanding and acceptance.     Dental advisory given  Plan Discussed with: CRNA  Anesthesia Plan Comments:        Anesthesia Quick Evaluation

## 2022-03-01 ENCOUNTER — Encounter (HOSPITAL_COMMUNITY): Payer: Self-pay | Admitting: Otolaryngology

## 2022-03-02 NOTE — Anesthesia Postprocedure Evaluation (Signed)
Anesthesia Post Note  Patient: Cynthia Moran  Procedure(s) Performed: CLOSED REDUCTION NASAL FRACTURE (Bilateral)     Patient location during evaluation: PACU Anesthesia Type: General Level of consciousness: awake and alert Pain management: pain level controlled Vital Signs Assessment: post-procedure vital signs reviewed and stable Respiratory status: spontaneous breathing, nonlabored ventilation, respiratory function stable and patient connected to nasal cannula oxygen Cardiovascular status: blood pressure returned to baseline and stable Postop Assessment: no apparent nausea or vomiting Anesthetic complications: no   No notable events documented.  Last Vitals:  Vitals:   02/27/22 1245 02/27/22 1255  BP:  113/76  Pulse: 60 (!) 55  Resp: 14 15  Temp:  36.6 C  SpO2: 100% 100%    Last Pain:  Vitals:   02/27/22 1255  TempSrc:   PainSc: 0-No pain                 Grafton Warzecha L Anvita Hirata

## 2022-05-28 ENCOUNTER — Encounter: Payer: Medicaid Other | Admitting: Nurse Practitioner

## 2022-12-25 IMAGING — CT CT MAXILLOFACIAL W/O CM
3 series · 16 of 47 positions shown, 19 images · non-contrast
Comparison: None Available.

CLINICAL DATA: Facial trauma, blunt.



[Series 3: facial/ orbits 2.0 h30s · axial · 0.39mm/px · z∈[-104,+44]mm · 10 of 86 slices shown, 13 images]
[im 6/86  brain]
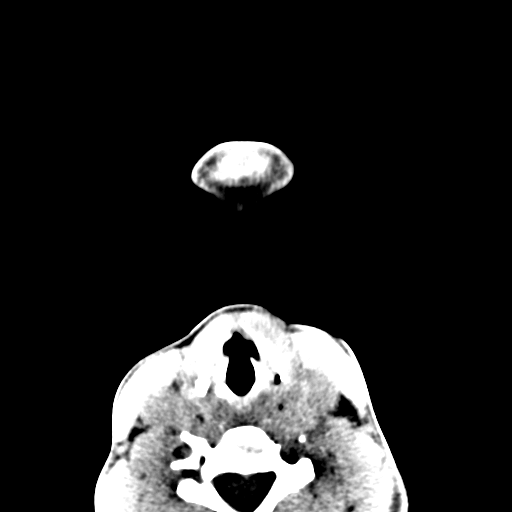
[im 6/86  bone]
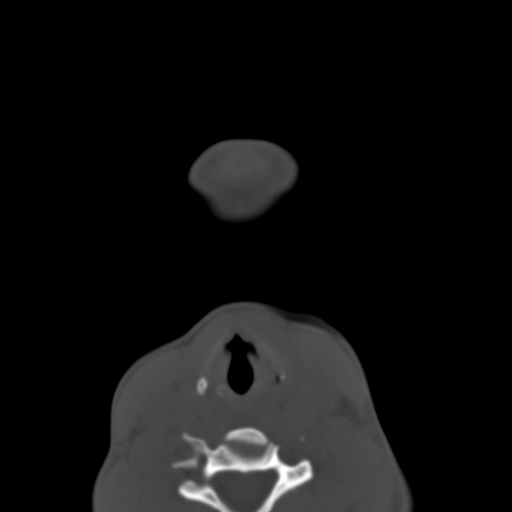
[im 15/86  bone]
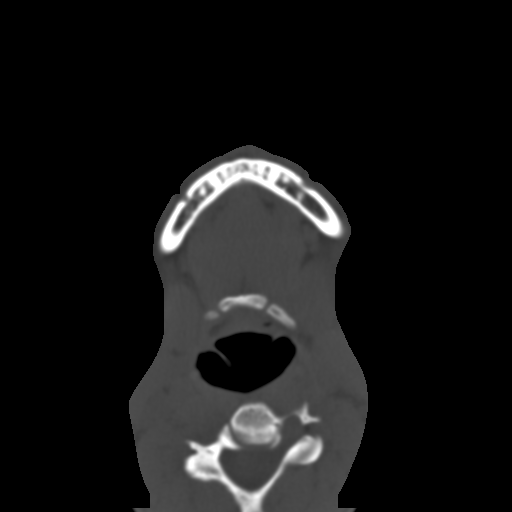
[im 24/86  bone]
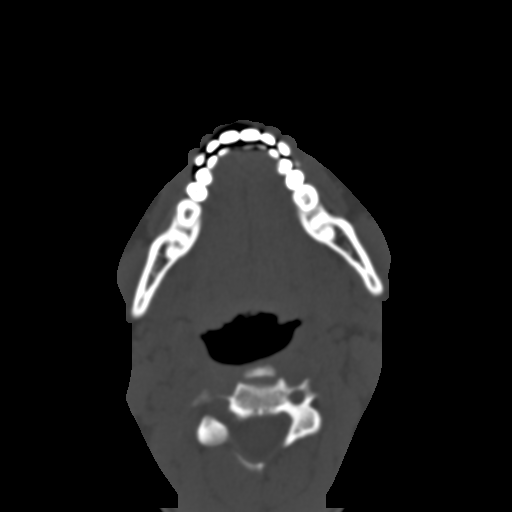
[im 30/86  bone]
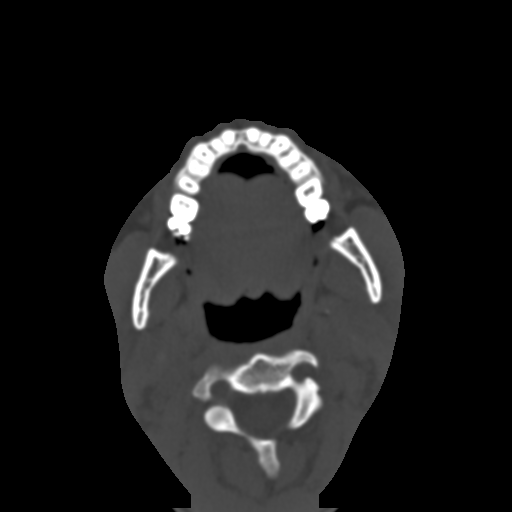
[im 39/86  brain]
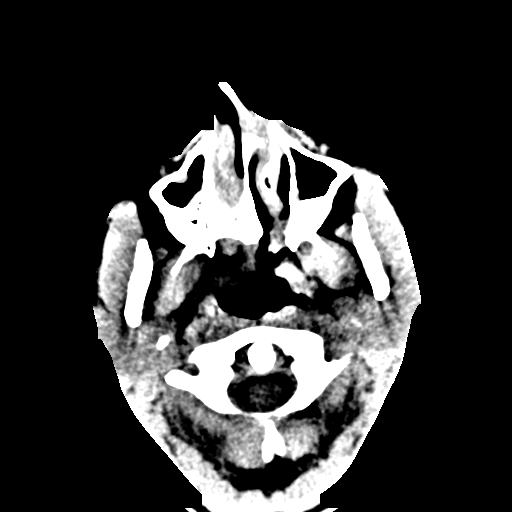
[im 39/86  bone]
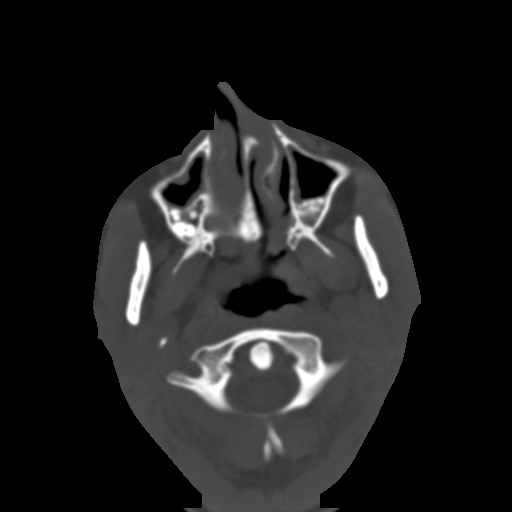
[im 47/86  bone]
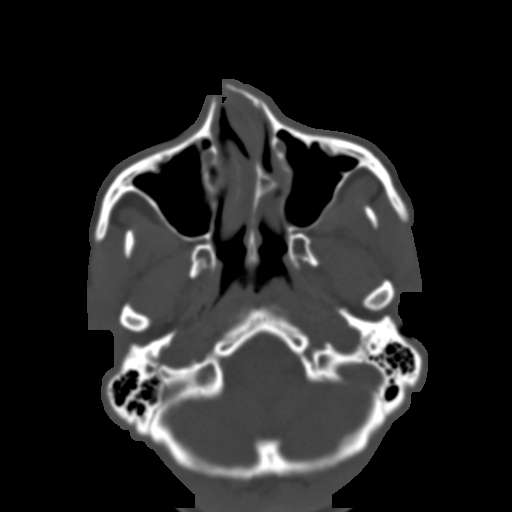
[im 56/86  bone]
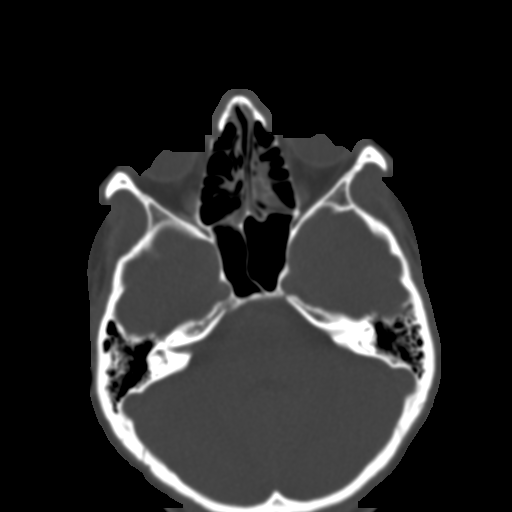
[im 65/86  bone]
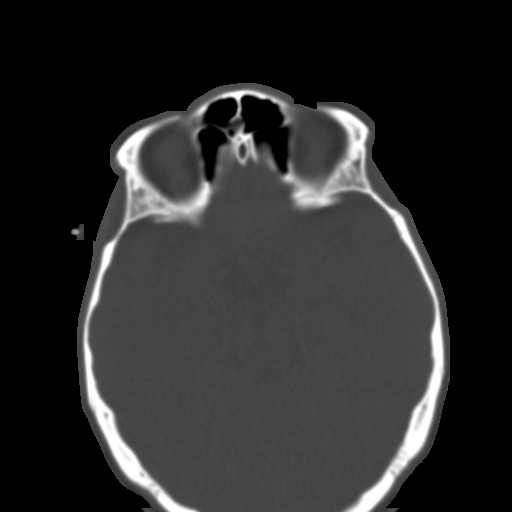
[im 71/86  brain]
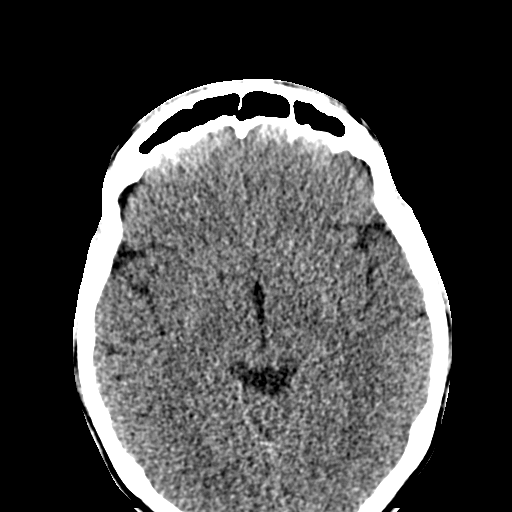
[im 71/86  bone]
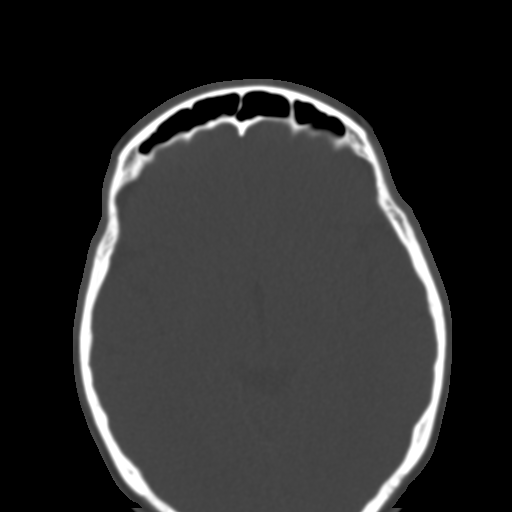
[im 80/86  bone]
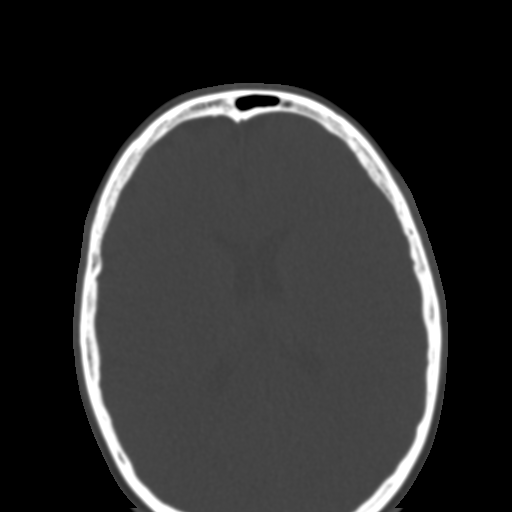

[Series 9: sagittal soft tissue · sagittal · 0.29mm/px · 3 of 96 slices shown]
[im 32/96  bone]
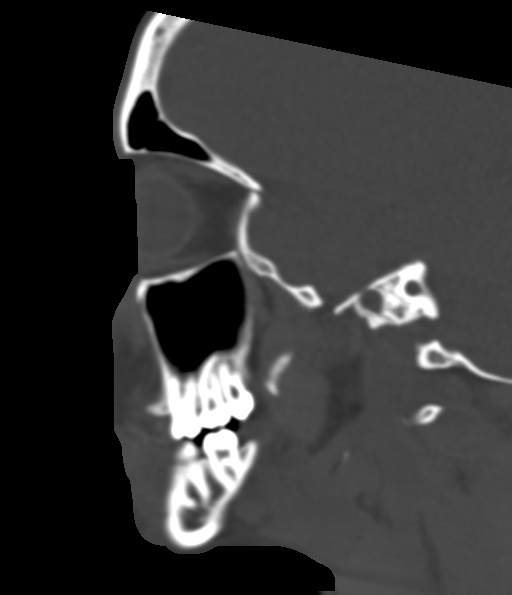
[im 48/96  bone]
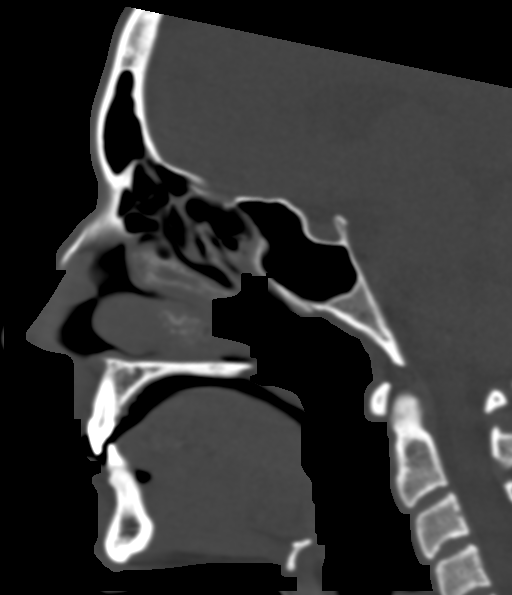
[im 64/96  bone]
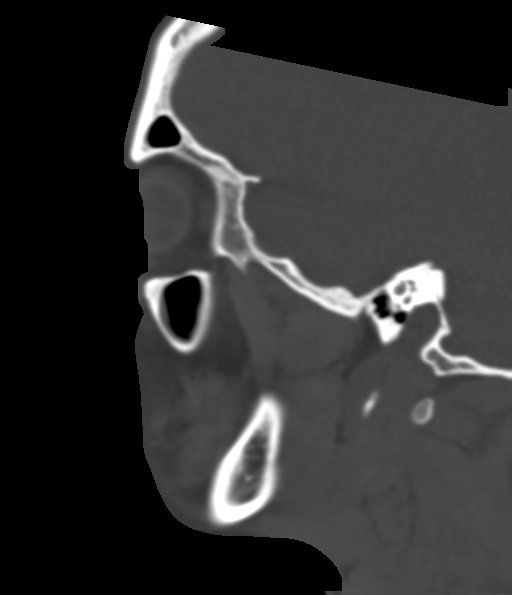

[Series 11: coronal soft tissue · coronal · 0.34mm/px · 3 of 75 slices shown]
[im 25/75  bone]
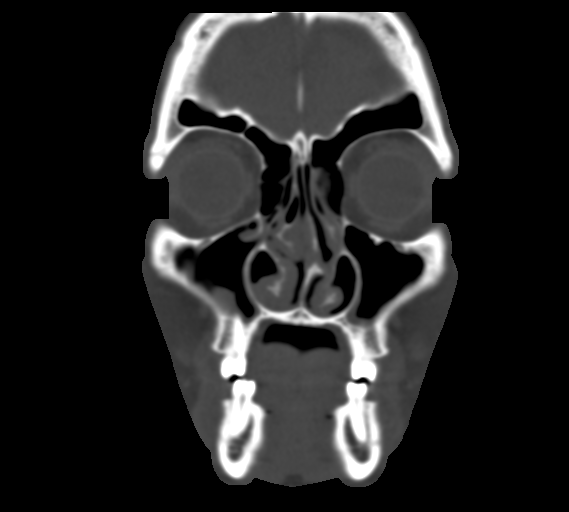
[im 33/75  bone]
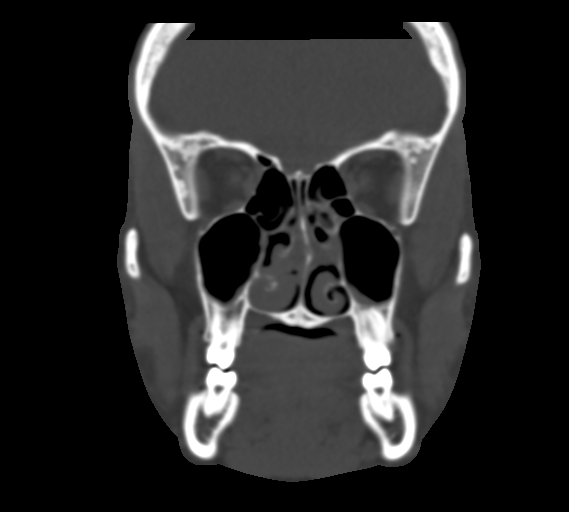
[im 42/75  bone]
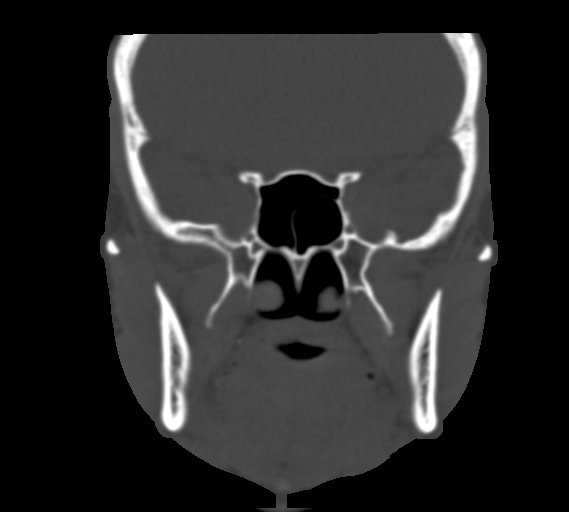

[16 of 47 positions shown; findings below may reference images not displayed]

FINDINGS: Osseous: Comminuted displaced nasal bone fractures are noted
bilaterally.

Orbits: Negative. No traumatic or inflammatory finding.

Sinuses: There is nasal septal deviation to the left with a bony
spur protruding into the left nasal cavity. Mucosal thickening is
noted in the maxillary sinuses and ethmoid air cells bilaterally. No
air-fluid levels.

Soft tissues: Mild soft tissue swelling is present at the nose.

Limited intracranial: No significant or unexpected finding.
IMPRESSION: Comminuted displaced nasal bone fractures bilaterally.

## 2023-09-18 ENCOUNTER — Other Ambulatory Visit (HOSPITAL_COMMUNITY): Payer: Self-pay

## 2024-01-18 ENCOUNTER — Emergency Department (HOSPITAL_COMMUNITY)
Admission: EM | Admit: 2024-01-18 | Discharge: 2024-01-18 | Disposition: A | Discharge status: Other Acute Inpatient Hospital | Source: Home / Self Care | Attending: Emergency Medicine | Admitting: Emergency Medicine

## 2024-01-18 ENCOUNTER — Inpatient Hospital Stay (HOSPITAL_COMMUNITY)
Admission: AD | Admit: 2024-01-18 | Discharge: 2024-01-23 | DRG: 885 | Disposition: A | Discharge status: Home / Self Care | Source: Intra-hospital | Attending: Psychiatry | Admitting: Psychiatry

## 2024-01-18 ENCOUNTER — Encounter (HOSPITAL_COMMUNITY): Payer: Self-pay | Admitting: Nurse Practitioner

## 2024-01-18 ENCOUNTER — Encounter (HOSPITAL_COMMUNITY): Payer: Self-pay | Admitting: Emergency Medicine

## 2024-01-18 ENCOUNTER — Other Ambulatory Visit: Payer: Self-pay

## 2024-01-18 DIAGNOSIS — Z9151 Personal history of suicidal behavior: Secondary | ICD-10-CM

## 2024-01-18 DIAGNOSIS — F411 Generalized anxiety disorder: Secondary | ICD-10-CM | POA: Diagnosis present

## 2024-01-18 DIAGNOSIS — Z79899 Other long term (current) drug therapy: Secondary | ICD-10-CM | POA: Diagnosis not present

## 2024-01-18 DIAGNOSIS — F603 Borderline personality disorder: Secondary | ICD-10-CM | POA: Diagnosis present

## 2024-01-18 DIAGNOSIS — F429 Obsessive-compulsive disorder, unspecified: Secondary | ICD-10-CM | POA: Diagnosis present

## 2024-01-18 DIAGNOSIS — F410 Panic disorder [episodic paroxysmal anxiety] without agoraphobia: Secondary | ICD-10-CM | POA: Diagnosis present

## 2024-01-18 DIAGNOSIS — D72829 Elevated white blood cell count, unspecified: Secondary | ICD-10-CM | POA: Insufficient documentation

## 2024-01-18 DIAGNOSIS — Z91048 Other nonmedicinal substance allergy status: Secondary | ICD-10-CM

## 2024-01-18 DIAGNOSIS — F321 Major depressive disorder, single episode, moderate: Secondary | ICD-10-CM | POA: Insufficient documentation

## 2024-01-18 DIAGNOSIS — Z5941 Food insecurity: Secondary | ICD-10-CM

## 2024-01-18 DIAGNOSIS — R45851 Suicidal ideations: Secondary | ICD-10-CM | POA: Insufficient documentation

## 2024-01-18 DIAGNOSIS — D72828 Other elevated white blood cell count: Secondary | ICD-10-CM | POA: Diagnosis present

## 2024-01-18 DIAGNOSIS — F064 Anxiety disorder due to known physiological condition: Secondary | ICD-10-CM | POA: Diagnosis present

## 2024-01-18 DIAGNOSIS — F1220 Cannabis dependence, uncomplicated: Secondary | ICD-10-CM | POA: Diagnosis present

## 2024-01-18 DIAGNOSIS — Z555 Less than a high school diploma: Secondary | ICD-10-CM

## 2024-01-18 DIAGNOSIS — Z87820 Personal history of traumatic brain injury: Secondary | ICD-10-CM | POA: Diagnosis not present

## 2024-01-18 DIAGNOSIS — F4320 Adjustment disorder, unspecified: Secondary | ICD-10-CM | POA: Insufficient documentation

## 2024-01-18 DIAGNOSIS — F17200 Nicotine dependence, unspecified, uncomplicated: Secondary | ICD-10-CM | POA: Insufficient documentation

## 2024-01-18 DIAGNOSIS — F4323 Adjustment disorder with mixed anxiety and depressed mood: Secondary | ICD-10-CM | POA: Diagnosis present

## 2024-01-18 DIAGNOSIS — F17290 Nicotine dependence, other tobacco product, uncomplicated: Secondary | ICD-10-CM | POA: Diagnosis present

## 2024-01-18 DIAGNOSIS — F329 Major depressive disorder, single episode, unspecified: Principal | ICD-10-CM | POA: Diagnosis present

## 2024-01-18 DIAGNOSIS — F332 Major depressive disorder, recurrent severe without psychotic features: Secondary | ICD-10-CM | POA: Diagnosis not present

## 2024-01-18 DIAGNOSIS — R001 Bradycardia, unspecified: Secondary | ICD-10-CM | POA: Diagnosis not present

## 2024-01-18 LAB — RAPID URINE DRUG SCREEN, HOSP PERFORMED
Amphetamines: NOT DETECTED
Barbiturates: NOT DETECTED
Benzodiazepines: NOT DETECTED
Cocaine: NOT DETECTED
Opiates: NOT DETECTED
Tetrahydrocannabinol: POSITIVE — AB

## 2024-01-18 LAB — COMPREHENSIVE METABOLIC PANEL WITH GFR
ALT: 16 U/L (ref 0–44)
AST: 18 U/L (ref 15–41)
Albumin: 4.2 g/dL (ref 3.5–5.0)
Alkaline Phosphatase: 61 U/L (ref 38–126)
Anion gap: 10 (ref 5–15)
BUN: 10 mg/dL (ref 6–20)
CO2: 23 mmol/L (ref 22–32)
Calcium: 9.6 mg/dL (ref 8.9–10.3)
Chloride: 105 mmol/L (ref 98–111)
Creatinine, Ser: 0.76 mg/dL (ref 0.44–1.00)
GFR, Estimated: >60 mL/min (ref >60)
Glucose, Bld: 101 mg/dL — ABNORMAL HIGH (ref 70–99)
Potassium: 3.7 mmol/L (ref 3.5–5.1)
Sodium: 138 mmol/L (ref 135–145)
Total Bilirubin: 0.7 mg/dL (ref 0.0–1.2)
Total Protein: 6.9 g/dL (ref 6.5–8.1)

## 2024-01-18 LAB — CBC
HCT: 41.3 % (ref 36.0–46.0)
Hemoglobin: 13.6 g/dL (ref 12.0–15.0)
MCH: 28.9 pg (ref 26.0–34.0)
MCHC: 32.9 g/dL (ref 30.0–36.0)
MCV: 87.9 fL (ref 80.0–100.0)
Platelets: 288 10*3/uL (ref 150–400)
RBC: 4.7 MIL/uL (ref 3.87–5.11)
RDW: 12.1 % (ref 11.5–15.5)
WBC: 13.3 10*3/uL — ABNORMAL HIGH (ref 4.0–10.5)
nRBC: 0 % (ref 0.0–0.2)

## 2024-01-18 LAB — ETHANOL: Alcohol, Ethyl (B): <10 mg/dL (ref <10)

## 2024-01-18 LAB — ACETAMINOPHEN LEVEL: Acetaminophen (Tylenol), Serum: <10 ug/mL — ABNORMAL LOW (ref 10–30)

## 2024-01-18 LAB — SALICYLATE LEVEL: Salicylate Lvl: <7 mg/dL — ABNORMAL LOW (ref 7.0–30.0)

## 2024-01-18 LAB — HCG, SERUM, QUALITATIVE: Preg, Serum: NEGATIVE

## 2024-01-18 MED ORDER — TRAZODONE HCL 50 MG PO TABS: 50.0000 mg | ORAL_TABLET | Freq: Every evening | ORAL | Status: DC | PRN

## 2024-01-18 MED ORDER — HYDROXYZINE HCL 25 MG PO TABS: 25.0000 mg | ORAL_TABLET | Freq: Three times a day (TID) | ORAL | Status: DC | PRN

## 2024-01-18 MED ORDER — HALOPERIDOL LACTATE 5 MG/ML IJ SOLN: 5.0000 mg | Freq: Three times a day (TID) | INTRAMUSCULAR | Status: DC | PRN

## 2024-01-18 MED ORDER — ACETAMINOPHEN 325 MG PO TABS
650.0000 mg | ORAL_TABLET | Freq: Four times a day (QID) | ORAL | Status: DC | PRN
  Administered 2024-01-21 – 2024-01-22 (×2): 650 mg via ORAL
  Filled 2024-01-18 (×2): qty 2

## 2024-01-18 MED ORDER — DIPHENHYDRAMINE HCL 50 MG/ML IJ SOLN: 50.0000 mg | Freq: Three times a day (TID) | INTRAMUSCULAR | Status: DC | PRN

## 2024-01-18 MED ORDER — LORAZEPAM 2 MG/ML IJ SOLN: 2.0000 mg | Freq: Three times a day (TID) | INTRAMUSCULAR | Status: DC | PRN

## 2024-01-18 MED ORDER — ALUM & MAG HYDROXIDE-SIMETH 200-200-20 MG/5ML PO SUSP: 30.0000 mL | ORAL | Status: DC | PRN

## 2024-01-18 MED ORDER — TRAZODONE HCL 50 MG PO TABS
50.0000 mg | ORAL_TABLET | Freq: Every evening | ORAL | Status: DC | PRN
  Administered 2024-01-19 – 2024-01-22 (×4): 50 mg via ORAL
  Filled 2024-01-18 (×4): qty 1

## 2024-01-18 MED ORDER — HYDROXYZINE HCL 10 MG PO TABS: 25.0000 mg | ORAL_TABLET | Freq: Two times a day (BID) | ORAL | Status: DC | PRN

## 2024-01-18 MED ORDER — MAGNESIUM HYDROXIDE 400 MG/5ML PO SUSP: 30.0000 mL | Freq: Every day | ORAL | Status: DC | PRN

## 2024-01-18 MED ORDER — HYDROXYZINE HCL 25 MG PO TABS
25.0000 mg | ORAL_TABLET | Freq: Three times a day (TID) | ORAL | Status: DC | PRN
  Administered 2024-01-19 – 2024-01-22 (×4): 25 mg via ORAL
  Filled 2024-01-18 (×4): qty 1

## 2024-01-18 MED ORDER — HALOPERIDOL 5 MG PO TABS: 5.0000 mg | ORAL_TABLET | Freq: Three times a day (TID) | ORAL | Status: DC | PRN

## 2024-01-18 MED ORDER — HALOPERIDOL LACTATE 5 MG/ML IJ SOLN: 10.0000 mg | Freq: Three times a day (TID) | INTRAMUSCULAR | Status: DC | PRN

## 2024-01-18 MED ORDER — DIPHENHYDRAMINE HCL 25 MG PO CAPS: 50.0000 mg | ORAL_CAPSULE | Freq: Three times a day (TID) | ORAL | Status: DC | PRN
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
## 2024-01-18 NOTE — Progress Notes (Signed)
 Pt has been accepted to [facility] on 01/18/2024 Bed assignment: 307-2  Pt meets inpatient criteria per: Eligha Bridegroom NP   Attending Physician will be: Dr. Cherie Dark   Report can be called to: Adult unit: [id number]  Pt can arrive after EKG, VS, Consent   Care Team Notified: [facility] Rona Ravens RN, Eligha Bridegroom NP, Myrlene Broker RN, [facility] RN, Devinny Harrington NT   [place] Jeilyn Reznik LCSW-A   01/18/2024 11:58 AM
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
## 2024-01-18 NOTE — Tx Team (Signed)
 Initial Treatment Plan 01/18/2024 5:43 PM [name] [id number]    PATIENT STRESSORS: financial, her father's health     PATIENT STRENGTHS: Ability for insight  Average or above average intelligence  Capable of independent living  Communication skills  Motivation for treatment/growth    PATIENT IDENTIFIED PROBLEMS: Financial Family Health.                      DISCHARGE CRITERIA:  Ability to meet basic life and health needs Reduction of life-threatening or endangering symptoms to within safe limits Safe-care adequate arrangements made  PRELIMINARY DISCHARGE PLAN: Return to previous living arrangement Return to previous work or school arrangements  PATIENT/FAMILY INVOLVEMENT: This treatment plan has been presented to and reviewed with the patient, Cynthia Moran,   The patient and family have been given the opportunity to ask questions and make suggestions.  Lillie Columbia, RN 01/18/2024, 5:43 PM
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
## 2024-01-18 NOTE — ED Notes (Signed)
 Report was given at 1400 to Minerva Areola, Charity fundraiser at [facility]

## 2024-01-18 NOTE — BH Assessment (Signed)
@  0454, Patient was deferred to [facility] for a telepsych assessment. The assigned care coordinator will provide updates regarding the scheduling of the assessment. [facility] care coordinator can be reached at [phone] for further information on the timing of the telepsych evaluation.

## 2024-01-18 NOTE — Progress Notes (Signed)
 Pt is a 23yo CF admitted to Dr. Leodis Sias services from [facility] ED.  Pt reports having intrusive thoughts of self harm.  Reports thoughts of driving car off of the road.  Pt states that her father has Cancer and " other stressors like financial" problems are contributing to these thoughts.  Pt was searched with no contraband found.  She does have mult tattoos that " I do myself... I use to cut but now I tattoo it is more therapeutic".  Pt states last tattoo done was last night.  Pt does have a small child at home which she reports is one of her motivators for self improvement.  Pt was escorted onto the unit and oriented to milieu and room.   Q checks started for safety and pt went to eat with peers.  No distress noted.

## 2024-01-19 ENCOUNTER — Encounter (HOSPITAL_COMMUNITY): Payer: Self-pay

## 2024-01-19 ENCOUNTER — Encounter (HOSPITAL_COMMUNITY): Payer: Self-pay | Admitting: Nurse Practitioner

## 2024-01-19 DIAGNOSIS — F17200 Nicotine dependence, unspecified, uncomplicated: Secondary | ICD-10-CM | POA: Insufficient documentation

## 2024-01-19 DIAGNOSIS — F332 Major depressive disorder, recurrent severe without psychotic features: Secondary | ICD-10-CM | POA: Diagnosis not present

## 2024-01-19 DIAGNOSIS — F411 Generalized anxiety disorder: Secondary | ICD-10-CM | POA: Insufficient documentation

## 2024-01-19 DIAGNOSIS — F1220 Cannabis dependence, uncomplicated: Secondary | ICD-10-CM | POA: Insufficient documentation

## 2024-01-19 DIAGNOSIS — F603 Borderline personality disorder: Secondary | ICD-10-CM | POA: Insufficient documentation

## 2024-01-19 MED ORDER — ONDANSETRON HCL 4 MG PO TABS
4.0000 mg | ORAL_TABLET | Freq: Three times a day (TID) | ORAL | Status: DC | PRN
  Administered 2024-01-19 – 2024-01-21 (×2): 4 mg via ORAL
  Filled 2024-01-19 (×2): qty 1

## 2024-01-19 MED ORDER — ESCITALOPRAM OXALATE 5 MG PO TABS
5.0000 mg | ORAL_TABLET | Freq: Every day | ORAL | Status: DC
  Administered 2024-01-19 – 2024-01-22 (×4): 5 mg via ORAL
  Filled 2024-01-19 (×6): qty 1

## 2024-01-19 MED ORDER — ENSURE ENLIVE PO LIQD
237.0000 mL | Freq: Two times a day (BID) | ORAL | Status: DC
  Administered 2024-01-20 – 2024-01-22 (×3): 237 mL via ORAL
  Filled 2024-01-19 (×11): qty 237

## 2024-01-19 MED ORDER — LURASIDONE HCL 20 MG PO TABS
20.0000 mg | ORAL_TABLET | Freq: Every day | ORAL | Status: DC
Start: 2024-01-19 — End: 2024-01-21
  Administered 2024-01-19 – 2024-01-20 (×2): 20 mg via ORAL
  Filled 2024-01-19 (×3): qty 1

## 2024-01-19 NOTE — Group Note (Signed)
 Date:  01/19/2024 Time:  9:18 AM  Group Topic/Focus:  Goals Group:   The focus of this group is to help patients establish daily goals to achieve during treatment and discuss how the patient can incorporate goal setting into their daily lives to aide in recovery.    Participation Level:  Active  Participation Quality:  Appropriate  Affect:  Appropriate   Erasmo Score 01/19/2024, 9:18 AM

## 2024-01-19 NOTE — BH IP Treatment Plan (Signed)
 Interdisciplinary Treatment and Diagnostic Plan Update  01/19/2024 Time of Session: 1115am Cynthia Moran MRN: [id number]  Principal Diagnosis: MDD (major depressive disorder)  Secondary Diagnoses: Principal Problem:   MDD (major depressive disorder)   Current Medications:  Current Facility-Administered Medications  Medication Dose Route Frequency Provider Last Rate Last Admin   acetaminophen (TYLENOL) tablet 650 mg  650 mg Oral Q6H PRN Eligha Bridegroom, NP       alum & mag hydroxide-simeth (MAALOX/MYLANTA) 200-200-20 MG/5ML suspension 30 mL  30 mL Oral Q4H PRN Eligha Bridegroom, NP       haloperidol (HALDOL) tablet 5 mg  5 mg Oral TID PRN Eligha Bridegroom, NP       And   diphenhydrAMINE (BENADRYL) capsule 50 mg  50 mg Oral TID PRN Eligha Bridegroom, NP       haloperidol lactate (HALDOL) injection 5 mg  5 mg Intramuscular TID PRN Eligha Bridegroom, NP       And   diphenhydrAMINE (BENADRYL) injection 50 mg  50 mg Intramuscular TID PRN Eligha Bridegroom, NP       And   LORazepam (ATIVAN) injection 2 mg  2 mg Intramuscular TID PRN Eligha Bridegroom, NP       haloperidol lactate (HALDOL) injection 10 mg  10 mg Intramuscular TID PRN Eligha Bridegroom, NP       And   diphenhydrAMINE (BENADRYL) injection 50 mg  50 mg Intramuscular TID PRN Eligha Bridegroom, NP       And   LORazepam (ATIVAN) injection 2 mg  2 mg Intramuscular TID PRN Eligha Bridegroom, NP       hydrOXYzine (ATARAX) tablet 25 mg  25 mg Oral TID PRN Eligha Bridegroom, NP       magnesium hydroxide (MILK OF MAGNESIA) suspension 30 mL  30 mL Oral Daily PRN Eligha Bridegroom, NP       traZODone (DESYREL) tablet 50 mg  50 mg Oral QHS PRN Eligha Bridegroom, NP       PTA Medications: Medications Prior to Admission  Medication Sig Dispense Refill Last Dose/Taking   divalproex (DEPAKOTE) 500 MG DR tablet Take 500 mg by mouth at bedtime. (Patient not taking: Reported on 01/18/2024)      hydrOXYzine (VISTARIL) 25 MG capsule Take 25 mg by mouth 2  (two) times daily as needed for anxiety. (Patient not taking: Reported on 01/18/2024)      ibuprofen (ADVIL) 200 MG tablet Take 600 mg by mouth 2 (two) times daily as needed (pain from nose fracture). (Patient not taking: Reported on 01/18/2024)      levonorgestrel-ethinyl estradiol (ALESSE) 0.1-20 MG-MCG tablet Take 1 tablet by mouth at bedtime. (Patient not taking: Reported on 01/18/2024)      metroNIDAZOLE (METROGEL) 0.75 % vaginal gel Place 1 Applicatorful vaginally at bedtime. (Patient not taking: Reported on 01/18/2024) 70 g 1    Multiple Vitamin (MULTIVITAMIN WITH MINERALS) TABS tablet Take 1 tablet by mouth at bedtime.       Patient Stressors:    Patient Strengths: Ability for insight  Average or above average intelligence  Capable of independent living  Printmaker for treatment/growth   Treatment Modalities: Medication Management, Group therapy, Case management,  1 to 1 session with clinician, Psychoeducation, Recreational therapy.   Physician Treatment Plan for Primary Diagnosis: MDD (major depressive disorder) Long Term Goal(s):     Short Term Goals:    Medication Management: Evaluate patient's response, side effects, and tolerance of medication regimen.  Therapeutic Interventions: 1 to 1 sessions, Unit  Group sessions and Medication administration.  Evaluation of Outcomes: Not Progressing  Physician Treatment Plan for Secondary Diagnosis: Principal Problem:   MDD (major depressive disorder)  Long Term Goal(s):     Short Term Goals:       Medication Management: Evaluate patient's response, side effects, and tolerance of medication regimen.  Therapeutic Interventions: 1 to 1 sessions, Unit Group sessions and Medication administration.  Evaluation of Outcomes: Not Progressing   RN Treatment Plan for Primary Diagnosis: MDD (major depressive disorder) Long Term Goal(s): Knowledge of disease and therapeutic regimen to maintain health will  improve  Short Term Goals: Ability to remain free from injury will improve, Ability to verbalize frustration and anger appropriately will improve, Ability to demonstrate self-control, Ability to participate in decision making will improve, Ability to verbalize feelings will improve, Ability to disclose and discuss suicidal ideas, Ability to identify and develop effective coping behaviors will improve, and Compliance with prescribed medications will improve  Medication Management: RN will administer medications as ordered by provider, will assess and evaluate patient's response and provide education to patient for prescribed medication. RN will report any adverse and/or side effects to prescribing provider.  Therapeutic Interventions: 1 on 1 counseling sessions, Psychoeducation, Medication administration, Evaluate responses to treatment, Monitor vital signs and CBGs as ordered, Perform/monitor CIWA, COWS, AIMS and Fall Risk screenings as ordered, Perform wound care treatments as ordered.  Evaluation of Outcomes: Not Progressing   LCSW Treatment Plan for Primary Diagnosis: MDD (major depressive disorder) Long Term Goal(s): Safe transition to appropriate next level of care at discharge, Engage patient in therapeutic group addressing interpersonal concerns.  Short Term Goals: Engage patient in aftercare planning with referrals and resources, Increase social support, Increase ability to appropriately verbalize feelings, Increase emotional regulation, Facilitate acceptance of mental health diagnosis and concerns, Facilitate patient progression through stages of change regarding substance use diagnoses and concerns, Identify triggers associated with mental health/substance abuse issues, and Increase skills for wellness and recovery  Therapeutic Interventions: Assess for all discharge needs, 1 to 1 time with Social worker, Explore available resources and support systems, Assess for adequacy in community  support network, Educate family and significant other(s) on suicide prevention, Complete Psychosocial Assessment, Interpersonal group therapy.  Evaluation of Outcomes: Not Progressing   Progress in Treatment: Attending groups: Yes. Participating in groups: Yes. Taking medication as prescribed: Yes. Toleration medication: Yes. Family/Significant other contact made: No, will contact:  consents pending Patient understands diagnosis: Yes. Discussing patient identified problems/goals with staff: Yes. Medical problems stabilized or resolved: Yes. Denies suicidal/homicidal ideation: Yes. Issues/concerns per patient self-inventory: No.  New problem(s) identified: No, Describe:  none  New Short Term/Long Term Goal(s): medication stabilization, elimination of SI thoughts, development of comprehensive mental wellness plan.    Patient Goals:  "Learn more coping skills and try to deal with my grief"  Discharge Plan or Barriers: Patient recently admitted. CSW will continue to follow and assess for appropriate referrals and possible discharge planning.    Reason for Continuation of Hospitalization: Anxiety Depression Medication stabilization Suicidal ideation  Estimated Length of Stay: 5-7 days  Last 3 [place] Suicide Severity Risk Score: Flowsheet Row [facility] Most recent reading at 01/18/2024  5:29 PM ED from 01/18/2024 in [facility] Most recent reading at 01/18/2024 12:34 AM Admission (Discharged) from 02/27/2022 in [place] [facility] Most recent reading at 02/27/2022  9:52 AM  C-SSRS RISK CATEGORY Moderate Risk  Moderate Risk No Risk       Last PHQ 2/9 Scores:    02/25/2022   11:03 AM  Depression screen PHQ 2/9  Decreased Interest 3  Down, Depressed, Hopeless 2  PHQ - 2 Score 5  Altered sleeping 2  Tired, decreased energy 3  Change in appetite 3  Feeling  bad or failure about yourself  2  Trouble concentrating 2  Moving slowly or fidgety/restless 0  Suicidal thoughts 0  PHQ-9 Score 17    Scribe for Treatment Team: Kathi Der, LCSWA 01/19/2024 1:14 PM
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
## 2024-01-19 NOTE — Progress Notes (Signed)
   01/19/24 2100  Psych Admission Type (Psych Patients Only)  Admission Status Voluntary  Psychosocial Assessment  Patient Complaints Self-harm thoughts  Eye Contact Fair  Facial Expression Anxious  Affect Anxious  Speech Logical/coherent  Interaction Assertive  Motor Activity Other (Comment)  Appearance/Hygiene Unremarkable  Behavior Characteristics Cooperative  Mood Anxious  Thought Process  Coherency WDL  Content WDL  Delusions None reported or observed  Perception WDL  Hallucination None reported or observed  Judgment Poor  Confusion None  Danger to Self  Current suicidal ideation? Passive  Description of Suicide Plan None  Self-Injurious Behavior No self-injurious ideation or behavior indicators observed or expressed   Agreement Not to Harm Self Yes  Description of Agreement Verbal Contract  Danger to Others  Danger to Others None reported or observed

## 2024-01-19 NOTE — Group Note (Signed)
 Date:  01/19/2024 Time:  8:55 PM  Group Topic/Focus:  Narcotics Anonymous (NA) Meeting    Participation Level:  Active  Participation Quality:  Appropriate  Affect:  Appropriate  Cognitive:  Appropriate  Insight: Appropriate  Engagement in Group:  Engaged  Modes of Intervention:  Socialization and Support  Additional Comments:  Patient attended NA Meeting.  Cynthia Moran 01/19/2024, 8:55 PM

## 2024-01-19 NOTE — Plan of Care (Signed)
  Problem: Education: Goal: Knowledge of [facility] Education information/materials will improve Outcome: Progressing   Problem: Education: Goal: Emotional status will improve Outcome: Not Progressing

## 2024-01-19 NOTE — Plan of Care (Signed)
   Problem: Education: Goal: Knowledge of [facility] Education information/materials will improve Outcome: Progressing Goal: Emotional status will improve Outcome: Progressing Goal: Mental status will improve Outcome: Progressing Goal: Verbalization of understanding the information provided will improve Outcome: Progressing

## 2024-01-19 NOTE — Group Note (Signed)
 Recreation Therapy Group Note   Group Topic:Team Building  Group Date: 01/19/2024 Start Time: 0935 End Time: 1000 Facilitators: Aneyah Lortz-McCall, LRT,CTRS Location: [address] Dayroom   Group Topic: Communication, Team Building, Problem Solving  Goal Area(s) Addresses:  Patient will effectively work with peer towards shared goal.  Patient will identify skills used to make activity successful.  Patient will identify how skills used during activity can be used to reach post d/c goals.   Intervention: STEM Activity  Activity: Straw Bridge. In teams of 3-5, patients were given 15 plastic drinking straws and an equal length of masking tape. Using the materials provided, patients were instructed to build a free standing bridge-like structure to suspend an everyday item (ex: puzzle box) off of the floor or table surface. All materials were required to be used by the team in their design. LRT facilitated post-activity discussion reviewing team process. Patients were encouraged to reflect how the skills used in this activity can be generalized to daily life post discharge.   Education: Pharmacist, community, Scientist, physiological, Discharge Planning   Education Outcome: Acknowledges education/In group clarification offered/Needs additional education.    Affect/Mood: Appropriate   Participation Level: Engaged   Participation Quality: Independent   Behavior: Appropriate   Speech/Thought Process: Focused   Insight: Good   Judgement: Good   Modes of Intervention: STEM Activity   Patient Response to Interventions:  Engaged   Education Outcome:  In group clarification offered    Clinical Observations/Individualized Feedback: Pt worked well with peers and offered suggestions in completing bridge. Pt also expressed some of the skills used in the activity can help when you need to make adjustments to the plan you and your support system develop.      Plan: Continue to engage patient in RT group  sessions 2-3x/week.   Kaisei Gilbo-McCall, LRT,CTRS 01/19/2024 12:01 PM

## 2024-01-19 NOTE — [facility] Group Notes (Signed)
 [facility] Group Notes:  (Nursing/MHT/Case Management/Adjunct)  Date:  01/19/2024  Time:  12:27 AM  Type of Therapy:   Wrap-up group  Participation Level:  Active  Participation Quality:  Appropriate  Affect:  Appropriate  Cognitive:  Appropriate  Insight:  Appropriate  Engagement in Group:  Engaged  Modes of Intervention:  Education  Summary of Progress/Problems: Goal to attend groups. Met goal. Rated day 7/10.   Noah Delaine 01/19/2024, 12:27 AM
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
## 2024-01-19 NOTE — Progress Notes (Signed)
   01/19/24 1000  Psych Admission Type (Psych Patients Only)  Admission Status Voluntary  Psychosocial Assessment  Patient Complaints Self-harm thoughts  Eye Contact Fair  Facial Expression Animated  Affect Appropriate to circumstance  Speech Logical/coherent  Interaction Assertive  Motor Activity Other (Comment) (WDL)  Appearance/Hygiene Unremarkable  Behavior Characteristics Cooperative  Mood Pleasant  Thought Process  Coherency WDL  Content WDL  Delusions None reported or observed  Perception WDL  Hallucination None reported or observed  Judgment Poor  Confusion None  Danger to Self  Current suicidal ideation? Passive  Description of Suicide Plan no plan  Self-Injurious Behavior Some self-injurious ideation observed or expressed.  No lethal plan expressed   Agreement Not to Harm Self Yes  Description of Agreement verbal  Danger to Others  Danger to Others None reported or observed

## 2024-01-20 DIAGNOSIS — F332 Major depressive disorder, recurrent severe without psychotic features: Secondary | ICD-10-CM | POA: Diagnosis not present

## 2024-01-20 LAB — LIPID PANEL
Cholesterol: 208 mg/dL — ABNORMAL HIGH (ref 0–200)
HDL: 85 mg/dL (ref >40)
LDL Cholesterol: 114 mg/dL — ABNORMAL HIGH (ref 0–99)
Total CHOL/HDL Ratio: 2.4 ratio
Triglycerides: 47 mg/dL (ref <150)
VLDL: 9 mg/dL (ref 0–40)

## 2024-01-20 LAB — VITAMIN D 25 HYDROXY (VIT D DEFICIENCY, FRACTURES): Vit D, 25-Hydroxy: 33.6 ng/mL (ref 30–100)

## 2024-01-20 LAB — TSH: TSH: 1.349 u[IU]/mL (ref 0.350–4.500)

## 2024-01-20 NOTE — Group Note (Signed)
 LCSW Group Therapy Note   Group Date: 01/20/2024 Start Time: 1100 End Time: 1200  Participation:  patient was present and actively participated in the discussion.    Type of Therapy:  Group Therapy  Title:  [facility]:  A Safe Space for Grief  Objective: [facility]:  A Safe Space for Grief aims to provide a compassionate environment for participants to process grief, explore its stages, and create personal rituals to honor loved ones.  Goals: Foster a safe, non-judgmental space for sharing grief experiences. Educate on the stages of grief, emphasizing that healing is unique to each individual. Introduce rituals as a meaningful way to cope and honor lost loved ones.  Summary: In [facility], participants explored their unique grief journeys, learning that grief is non-linear and personal. We discussed the 5 stages (denial, anger, bargaining, depression, and acceptance), and how rituals--such as lighting candles or memory walks--can offer comfort. Emphasis was placed on self-care, emotional expression, and gratitude for loved ones. Healing was framed as a process without a set timeline, with a focus on individualized paths to honoring loss.  Therapeutic Modalities: Grief Counseling: Providing emotional support through active listening and validation of feelings. Elements of DBT:  Mindfulness Practices: Encouraging present-moment awareness to reduce emotional distress. Cognitive Behavioral Therapy (CBT): Challenging negative thought patterns associated with grief.   Alla Feeling, LCSWA 01/20/2024  2:44 PM

## 2024-01-20 NOTE — [facility] Group Notes (Signed)
 [facility] Group Notes:  (Nursing/MHT/Case Management/Adjunct)  Date:  01/20/2024  Time:  2000  Type of Therapy:   WRap up group  Participation Level:  Active  Participation Quality:  Appropriate, Attentive, Sharing, and Supportive  Affect:  Appropriate  Cognitive:  Alert  Insight:  Improving  Engagement in Group:  Engaged  Modes of Intervention:  Clarification, Education, and Socialization  Summary of Progress/Problems: Positive thinking and self-care were discussed.   Cynthia Moran S 01/20/2024, 9:01 PM

## 2024-01-20 NOTE — Plan of Care (Signed)
   Problem: Education: Goal: Knowledge of [facility] Education information/materials will improve Outcome: Progressing Goal: Emotional status will improve Outcome: Progressing Goal: Mental status will improve Outcome: Progressing Goal: Verbalization of understanding the information provided will improve Outcome: Progressing

## 2024-01-20 NOTE — Progress Notes (Signed)
   01/20/24 1800  Psych Admission Type (Psych Patients Only)  Admission Status Voluntary  Psychosocial Assessment  Patient Complaints Self-harm thoughts  Eye Contact Fair  Facial Expression Anxious  Affect Anxious  Speech Logical/coherent  Interaction Assertive  Motor Activity Other (Comment) (wnl)  Appearance/Hygiene Unremarkable  Behavior Characteristics Cooperative  Mood Pleasant  Thought Process  Coherency WDL  Content WDL  Delusions None reported or observed  Perception WDL  Hallucination None reported or observed  Judgment Poor  Confusion WDL  Danger to Self  Current suicidal ideation? Passive  Description of Suicide Plan none  Self-Injurious Behavior No self-injurious ideation or behavior indicators observed or expressed   Agreement Not to Harm Self Yes  Description of Agreement verbal  Danger to Others  Danger to Others None reported or observed

## 2024-01-20 NOTE — Plan of Care (Signed)

## 2024-01-20 NOTE — Progress Notes (Cosign Needed Addendum)
 Initial [facility] MD Progress Note  01/20/2024 4:20 PM Cynthia Moran  MRN:  [id number]  Principal Problem: MDD (major depressive disorder) Diagnosis: Principal Problem:   MDD (major depressive disorder) Active Problems:   Borderline personality disorder (HCC)   Generalized anxiety disorder   Cannabis use disorder, severe, dependence (HCC)   Tobacco use disorder  Chart review from last 24 hours: Chart reviewed today. Patient discussed at multidisciplinary team meeting.  Slightly bradycardic with a pulse rate of 56, vital signs otherwise within defined limit. Documented sleep hours: 7.75. Nursing staff reported patient expressed feeling a little lighter today with a reduction in the intensity of suicidal thoughts, and improved sleep.  They also reported that the patient was on the phone in a heated argument last night.  She is compliant with routine psychotropic medication regimen without difficulty. Required as needed hydroxyzine, trazodone, and Zofran (nausea) last night, according to the nursing record.    Yesterday the psychiatry team made the following recommendations:  -- Initiate Lexapro 5 mg daily at bedtime -- Initiate lurasidone 20 mg daily at bedtime with food    Daily Evaluation: On assessment today, the patient reports "feeling a little lighter" today.  She states, "I feel better than when I got here."  She rates both her anxiety and depression as a 5 out of 10, with 10 being most severe.  She attributes her improvement to the medication, stating it had an "instant effect."  Regarding sleep, she reports improvement after receiving antinausea and sleep medications.  She acknowledges experiencing elevated anxiety due to abandonment issues, particularly when alone, leading to nighttime worry.  Her appetite has improved with administration of anti-nausea medication and nutritional supplements, consuming 100% of her Ensure.  She reports a slight increase in energy and improved concentration since  settling into the unit. Denies having any suicidal thoughts. Denies having any suicidal intent and plan. Denies having any HI. Denies having psychotic symptoms. The patient reports that she has been actively attending group therapy sessions and identifies her daily goals as ensuring proper medication management and consisting her participation.  When questioned about a heated phone argument the previous night, she recalls discussing household responsibilities with her spouse, who desires her return home to care for the daughter during his work hours.  She also reports familiar disputes involving her grandmother.  She expresses interest in resuming therapy at [facility] of the [place] post-discharge and inquires about grief therapy due to her father's battle with stage IV cancer.  He declines any medication adjustments, stating current medications are satisfactory.  Consent is obtained for chaplain consult to provide additional emotional support. Denies having side effects to current psychiatric medications.   The patient exhibits significant improvement in mood, with decreased anxiety and depression levels.  She demonstrates insight into her mental health and coping mechanisms.  Her proactive approach to therapy indicates a positive trajectory. No EPS symptoms were reported or observed during the exam.     The case was discussed with the attending psychiatrist, V. Izediuno, with the plan to continue current psychiatric medication regimen. We will continue monitoring for mood stability, safety, and medication effectiveness.  Chaplain consult placed as consented.  Total Time spent with patient: 45 minutes  Past Psychiatric History: See H&P   Past Medical History:  Past Medical History:  Diagnosis Date   ADHD (attention deficit hyperactivity disorder)    ADHD   Anxiety    Bipolar disorder (HCC)    Depression     Past Surgical  History:  Procedure Laterality Date   CESAREAN SECTION      CLOSED REDUCTION NASAL FRACTURE Bilateral 02/27/2022   Procedure: CLOSED REDUCTION NASAL FRACTURE;  Surgeon: Laren Boom, DO;  Location: [facility] OR;  Service: ENT;  Laterality: Bilateral;   TONSILLECTOMY     Family History:  Family History  Problem Relation Age of Onset   Healthy Mother    Healthy Father    Family Psychiatric  History: See H&P  Social History:  Social History   Substance and Sexual Activity  Alcohol Use Yes   Comment: occaisonal wine     Social History   Substance and Sexual Activity  Drug Use Yes   Types: Marijuana   Comment: Last use 02/24/22    Social History   Socioeconomic History   Marital status: Single    Spouse name: Not on file   Number of children: Not on file   Years of education: Not on file   Highest education level: Not on file  Occupational History   Not on file  Tobacco Use   Smoking status: Never   Smokeless tobacco: Never  Vaping Use   Vaping status: Some Days   Substances: THC  Substance and Sexual Activity   Alcohol use: Yes    Comment: occaisonal wine   Drug use: Yes    Types: Marijuana    Comment: Last use 02/24/22   Sexual activity: Yes    Birth control/protection: Pill  Other Topics Concern   Not on file  Social History Narrative   Not on file   Social Drivers of Health   Financial Resource Strain: Not on file  Food Insecurity: Food Insecurity Present (01/18/2024)   Hunger Vital Sign    Worried About Running Out of Food in the Last Year: Never true    Ran Out of Food in the Last Year: Sometimes true  Transportation Needs: No Transportation Needs (01/18/2024)   PRAPARE - Administrator, Civil Service (Medical): No    Lack of Transportation (Non-Medical): No  Physical Activity: Not on file  Stress: Not on file  Social Connections: Patient Unable To Answer (01/18/2024)   Social Connection and Isolation Panel [NHANES]    Frequency of Communication with Friends and Family: Patient unable to answer     Frequency of Social Gatherings with Friends and Family: Patient unable to answer    Attends Religious Services: Patient unable to answer    Active Member of Clubs or Organizations: Patient unable to answer    Attends Banker Meetings: Patient unable to answer    Marital Status: Patient unable to answer   Additional Social History:                          Current Medications: Current Facility-Administered Medications  Medication Dose Route Frequency Provider Last Rate Last Admin   acetaminophen (TYLENOL) tablet 650 mg  650 mg Oral Q6H PRN Eligha Bridegroom, NP       alum & mag hydroxide-simeth (MAALOX/MYLANTA) 200-200-20 MG/5ML suspension 30 mL  30 mL Oral Q4H PRN Eligha Bridegroom, NP       haloperidol (HALDOL) tablet 5 mg  5 mg Oral TID PRN Eligha Bridegroom, NP       And   diphenhydrAMINE (BENADRYL) capsule 50 mg  50 mg Oral TID PRN Eligha Bridegroom, NP       haloperidol lactate (HALDOL) injection 5 mg  5 mg Intramuscular TID PRN Eligha Bridegroom,  NP       And   diphenhydrAMINE (BENADRYL) injection 50 mg  50 mg Intramuscular TID PRN Eligha Bridegroom, NP       And   LORazepam (ATIVAN) injection 2 mg  2 mg Intramuscular TID PRN Eligha Bridegroom, NP       haloperidol lactate (HALDOL) injection 10 mg  10 mg Intramuscular TID PRN Eligha Bridegroom, NP       And   diphenhydrAMINE (BENADRYL) injection 50 mg  50 mg Intramuscular TID PRN Eligha Bridegroom, NP       And   LORazepam (ATIVAN) injection 2 mg  2 mg Intramuscular TID PRN Eligha Bridegroom, NP       escitalopram (LEXAPRO) tablet 5 mg  5 mg Oral QHS Radiance Deady H, NP   5 mg at 01/19/24 2125   feeding supplement (ENSURE ENLIVE / ENSURE PLUS) liquid 237 mL  237 mL Oral BID BM Barbarann Kelly H, NP   237 mL at 01/20/24 1037   hydrOXYzine (ATARAX) tablet 25 mg  25 mg Oral TID PRN Eligha Bridegroom, NP   25 mg at 01/19/24 2126   lurasidone (LATUDA) tablet 20 mg  20 mg Oral QHS Avigail Pilling H, NP   20 mg at  01/19/24 2126   magnesium hydroxide (MILK OF MAGNESIA) suspension 30 mL  30 mL Oral Daily PRN Eligha Bridegroom, NP       ondansetron [facility]) tablet 4 mg  4 mg Oral Q8H PRN Sindy Guadeloupe, NP   4 mg at 01/19/24 2126   traZODone (DESYREL) tablet 50 mg  50 mg Oral QHS PRN Eligha Bridegroom, NP   50 mg at 01/19/24 2125    Lab Results:  Results for orders placed or performed during the hospital encounter of 01/18/24 (from the past 48 hours)  Lipid panel     Status: Abnormal   Collection Time: 01/20/24  6:31 AM  Result Value Ref Range   Cholesterol 208 (H) 0 - 200 mg/dL   Triglycerides 47 <098 mg/dL   HDL 85 >11 mg/dL   Total CHOL/HDL Ratio 2.4 RATIO   VLDL 9 0 - 40 mg/dL   LDL Cholesterol 914 (H) 0 - 99 mg/dL    Comment:        Total Cholesterol/HDL:CHD Risk Coronary Heart Disease Risk Table                     Men   Women  1/2 Average Risk   3.4   3.3  Average Risk       5.0   4.4  2 X Average Risk   9.6   7.1  3 X Average Risk  23.4   11.0        Use the calculated Patient Ratio above and the CHD Risk Table to determine the patient's CHD Risk.        ATP III CLASSIFICATION (LDL):  <100     mg/dL   Optimal  782-956  mg/dL   Near or Above                    Optimal  130-159  mg/dL   Borderline  213-086  mg/dL   High  >578     mg/dL   Very High Performed at [facility], [address], [place], [place] [address]   VITAMIN D 25 Hydroxy (Vit-D Deficiency, Fractures)     Status: None   Collection Time: 01/20/24  6:31 AM  Result Value Ref Range   Vit D, 25-Hydroxy 33.60 30 - 100 ng/mL    Comment: (NOTE) Vitamin D deficiency has been defined by the Institute of Medicine  and an Endocrine Society practice guideline as a level of serum 25-OH  vitamin D less than 20 ng/mL (1,2). The Endocrine Society went on to  further define vitamin D insufficiency as a level between 21 and 29  ng/mL (2).  1. IOM (Institute of Medicine). 2010. Dietary reference intakes for   calcium and D. [place]: [employer]. 2. Holick MF, Binkley [place], Bischoff-Ferrari HA, et al. Evaluation,  treatment, and prevention of vitamin D deficiency: an Endocrine  Society clinical practice guideline, JCEM. 2011 Jul; 96(7): 1911-30.  Performed at [facility], [address], [place], [place] [address]   TSH     Status: None   Collection Time: 01/20/24  6:31 AM  Result Value Ref Range   TSH 1.349 0.350 - 4.500 uIU/mL    Comment: Performed by a 3rd Generation assay with a functional sensitivity of <=0.01 uIU/mL. Performed at [facility], [address], [place], [place] [address]     Blood Alcohol level:  Lab Results  Component Value Date   ETH <10 01/18/2024    Metabolic Disorder Labs: No results found for: "HGBA1C", "MPG" No results found for: "PROLACTIN" Lab Results  Component Value Date   CHOL 208 (H) 01/20/2024   TRIG 47 01/20/2024   HDL 85 01/20/2024   CHOLHDL 2.4 01/20/2024   VLDL 9 01/20/2024   LDLCALC 114 (H) 01/20/2024    Physical Findings: AIMS:  , , 0 ,  ,    CIWA:   N/A COWS:   N/A  Musculoskeletal: Strength & Muscle Tone: within normal limits Gait & Station: normal Patient leans: N/A  Psychiatric Specialty Exam:  Presentation  General Appearance:  Casual  Eye Contact: Good  Speech: Clear and Coherent  Speech Volume: Normal  Handedness: -- (Did not assess)   Mood and Affect  Mood: Anxious; Depressed; Hopeless; Worthless  Affect: Solicitor Processes: Coherent; Goal Directed  Descriptions of Associations:Intact  Orientation:Full (Time, Place and Person)  Thought Content:Logical  History of Schizophrenia/Schizoaffective disorder:No data recorded Duration of Psychotic Symptoms:No data recorded Hallucinations:Hallucinations: None  Ideas of Reference:None  Suicidal Thoughts:Suicidal Thoughts: No SI Passive Intent and/or Plan: --  (Denies)  Homicidal Thoughts:Homicidal Thoughts: No   Sensorium  Memory: Immediate Fair; Recent Fair  Judgment: Impaired  Insight: Lacking   Executive Functions  Concentration: Fair  Attention Span: Fair  Recall: [employer] of Knowledge: Fair  Language: Fair   Psychomotor Activity  Psychomotor Activity: Psychomotor Activity: Normal   Assets  Assets: Resilience   Sleep  Sleep: Sleep: Poor    Physical Exam: Physical Exam Vitals and nursing note reviewed.  Constitutional:      General: She is not in acute distress.    Appearance: Normal appearance. She is not ill-appearing.  Cardiovascular:     Rate and Rhythm: Bradycardia present.  Pulmonary:     Effort: No respiratory distress.     Comments: Pulse rate 56 Neurological:     Mental Status: She is alert and oriented to person, place, and time.    Review of Systems  Constitutional: Negative.   Respiratory:  Negative for shortness of breath.   Cardiovascular:  Negative for chest pain and palpitations.  Gastrointestinal:  Negative for constipation, diarrhea, nausea and vomiting.  Musculoskeletal:  Negative for falls.  Skin: Negative.   Neurological:  Negative for dizziness, tingling, tremors and headaches.  Psychiatric/Behavioral:  Positive for depression and substance abuse. Negative for hallucinations and suicidal ideas. The patient is nervous/anxious. The patient does not have insomnia.    Blood pressure 127/87, pulse 64, temperature 98.2 F (36.8 C), temperature source Oral, resp. rate 16, height 5\' 2"  (1.575 m), weight 57.7 kg, last menstrual period 01/11/2024, SpO2 96%. Body mass index is 23.27 kg/m.   Treatment Plan Summary: Daily contact with patient to assess and evaluate symptoms and progress in treatment and Medication management  ASSESSMENT: 23 year old female patient presents with significant biological factors impacting her mental health. She reports a history of depressive  symptoms including hopelessness, worthlessness, disturbed sleep, anhedonia, and decreased appetite. She also experiences anxiety symptoms such as excessive worrying, restlessness, and panic attacks. She has a history of concussion and daily cannabis use, which may contribute to her mental health challenges. The patient exhibits traits of borderline personality disorder, including fear of abandonment, unstable relationships, and impulsivity. She struggles with intense anger outbursts and labile mood. She is not currently on any medications but has consented to pharmacologic intervention.   Diagnoses / Active Problems: Principal Problem:   MDD (major depressive disorder) Active Problems:   Borderline personality disorder (HCC)   Generalized anxiety disorder   Cannabis use disorder, severe, dependence (HCC)   Tobacco use disorder              PLAN: Safety and Monitoring:  -- Voluntary admission to inpatient psychiatric unit for safety, stabilization and treatment -- Daily contact with patient to assess and evaluate symptoms and progress in treatment             -- Patient's case to be discussed in multi-disciplinary team meeting             -- Observation Level : q15 minute checks             -- Vital signs:  q12 hours             -- Precautions: suicide, elopement, and assault   2. Psychiatric Diagnoses and Treatment:    #MDD  Borderline Personality Disorder  GAD -- Continue Lexapro 5 mg daily at bedtime -- Continue lurasidone 20 mg daily at bedtime with food    --  The risks/benefits/side-effects/alternatives to this medication were discussed in detail with the patient and time was given for questions. The patient consents to medication trial.  -- FDA -- Metabolic profile and EKG monitoring obtained while on an atypical antipsychotic (BMI: Lipid Panel: HbgA1c: QTc:)    #Continue As Needed Medications --Hydroxyzine 25 mg oral, 3 times daily as needed, anxiety --Trazodone 50 mg, oral,  daily at bedtime as needed, sleep   #Continue BH Agitation Protocol --Haldol 5 mg, oral, 3 times daily as needed, mild agitation --Benadryl 50 mg, oral, 3 times daily as needed, mild agitation                                     OR  --Haldol injection 5 mg, IM, 3 times daily as needed, moderate agitation --Benadryl injection 50 mg, IM, 3 times daily as needed, moderate agitation --Ativan injection 2 mg, IM, 3 times daily as needed, moderate agitation  OR --Haldol injection 10 mg, IM, 3 times daily as needed, severe agitation --Benadryl injection 50 mg, IM, 3 times daily as needed, severe agitation --Ativan injection 2 mg, IM, 3 times daily as needed, severe agitation     -- Encouraged patient to participate in unit milieu and in scheduled group therapies  -- Short Term Goals: Ability to identify changes in lifestyle to reduce recurrence of condition will improve, Ability to verbalize feelings will improve, Ability to disclose and discuss suicidal ideas, Ability to demonstrate self-control will improve, Ability to identify and develop effective coping behaviors will improve, Ability to maintain clinical measurements within normal limits will improve, Compliance with prescribed medications will improve, and Ability to identify triggers associated with substance abuse/mental health issues will improve -- Long Term Goals: Improvement in symptoms so as ready for discharge                3. Medical Issues Being Addressed:               #Tobacco Use Disorder -- Declined Nicotine patch              -- Smoking cessation encouraged   #Continue As Needed Medications --Tylenol 650 mg every 6 hours, as needed, mild pain, fever --Maalox/Mylanta 30 mL oral every 4 hours as needed, indigestion --Milk of Magnesia 30 mL oral daily as needed, mild constipation   4. Labs    CMP: Glucose slightly elevated at 101 CBC: WBC elevated at 13.3              Labs Ordered for  01/20/24 Reviewed: Hemoglobin A1c: In process Lipid Panel: Cholesterol ^ 208  LDL^114 TSH: Within range  Vitamin D: Within range   UDS: + THC    EKG: Sinus bradycardia  QT 390/QTc 365     5. Discharge Planning:  -- Social work and case management to assist with discharge planning and identification of hospital follow-up needs prior to discharge             -- Estimated LOS: 5-7 days             -- Discharge Concerns: Need to establish a safety plan; Medication compliance and effectiveness             -- Discharge Goals: Return home with outpatient referrals for mental health follow-up including medication management/psychotherapy       I certify that inpatient services furnished can reasonably be expected to improve the patient's condition.     Norma Fredrickson, NP 01/20/2024, 4:20 PM

## 2024-01-21 DIAGNOSIS — F332 Major depressive disorder, recurrent severe without psychotic features: Secondary | ICD-10-CM | POA: Diagnosis not present

## 2024-01-21 LAB — HEMOGLOBIN A1C
Hgb A1c MFr Bld: 5.6 % (ref 4.8–5.6)
Mean Plasma Glucose: 114 mg/dL

## 2024-01-21 MED ORDER — LURASIDONE HCL 40 MG PO TABS
40.0000 mg | ORAL_TABLET | Freq: Every day | ORAL | Status: DC
  Administered 2024-01-21 – 2024-01-22 (×2): 40 mg via ORAL
  Filled 2024-01-21 (×4): qty 1

## 2024-01-21 NOTE — Progress Notes (Signed)
   01/21/24 0900  Psych Admission Type (Psych Patients Only)  Admission Status Voluntary  Psychosocial Assessment  Patient Complaints None  Eye Contact Fair  Facial Expression Animated  Affect Appropriate to circumstance  Speech Logical/coherent  Interaction Assertive  Motor Activity Other (Comment) (WNL)  Appearance/Hygiene Unremarkable  Behavior Characteristics Cooperative  Mood Pleasant  Thought Process  Coherency WDL  Content WDL  Delusions None reported or observed  Perception WDL  Hallucination None reported or observed  Judgment Poor  Danger to Self  Current suicidal ideation? Denies  Self-Injurious Behavior No self-injurious ideation or behavior indicators observed or expressed   Agreement Not to Harm Self Yes  Description of Agreement verbally contracts  Danger to Others  Danger to Others None reported or observed

## 2024-01-21 NOTE — Progress Notes (Signed)
Pt attended AA group and actively participated.  

## 2024-01-21 NOTE — Plan of Care (Signed)
   Problem: Coping: Goal: Ability to demonstrate self-control will improve Outcome: Progressing   Problem: Safety: Goal: Periods of time without injury will increase Outcome: Progressing

## 2024-01-21 NOTE — Progress Notes (Signed)
 [facility] MD Progress Note  01/21/2024 8:39 AM Cynthia Moran  MRN:  [id number]  Principal Problem: MDD (major depressive disorder) Diagnosis: Principal Problem:   MDD (major depressive disorder) Active Problems:   Borderline personality disorder (HCC)   Generalized anxiety disorder   Cannabis use disorder, severe, dependence (HCC)   Tobacco use disorder  Chart review from last 24 hours: Chart reviewed today. Patient discussed at multidisciplinary team meeting. Blood pressure 120/90, vital signs otherwise within defined limit. Documented sleep hours: 7.75. Nursing staff reported the patient received Zofran due to complaints of nausea and vomiting; denies suicidal or homicidal ideation and psychotic symptoms. She is compliant with routine psychotropic medication regimen without difficulty. Required as needed hydroxyzine, trazodone last night, and Zofran (nausea) this morning, according to the nursing record.    Daily Evaluation: On assessment today, the patient described her mood as "alright" and reports that she is feeling "a little lighter."  She reports that she misses her family and her daughter.  She reports continued improvements in her anxiety and depression symptoms.  She states that her sleep continues to improve.  However, she experienced nausea and vomiting this morning after eating and attributes it to anxiety and adjustment to the new environment.  She acknowledges that she declined the Ensure supplement offered last night.  The patient reports that her energy and concentration are improving.  She denies any suicidal or homicidal ideation, as well as any psychotic symptoms.  She states that she has been attending all group sessions on the unit and identifies her daily goal as continuing to participate in treatment.  She outlined her short-term goals after discharge, which include adhering to her prescribed medication regimen and continuing therapy, especially grief therapy.  She also shared that  her spouse has become more supportive since her admission and is now allowing her to make her own decisions.  She reports that he even suggested couples therapy, which she viewed as a positive development.  She denies experiencing any side effects from psychotropic medications.  The patient appears to be responding well to treatment.  Her mood is stabilizing, she is showing consistent progress in managing anxiety and depressive symptoms.  She remains actively engaged in group therapy and demonstrates insight into her mental health needs and discharge goals.  No EPS symptoms were observed or reported during the assessment.   The case was reviewed with the attending psychiatrist, V. Izediuno.  Lurasidone was increased to 40 mg by the attending psychiatrist. Tentative discharge planned for Sunday, April 13, contingent on continued progress and stability.  Total Time spent with patient: 45 minutes  Past Psychiatric History: See H&P   Past Medical History:  Past Medical History:  Diagnosis Date   ADHD (attention deficit hyperactivity disorder)    ADHD   Anxiety    Bipolar disorder (HCC)    Depression     Past Surgical History:  Procedure Laterality Date   CESAREAN SECTION     CLOSED REDUCTION NASAL FRACTURE Bilateral 02/27/2022   Procedure: CLOSED REDUCTION NASAL FRACTURE;  Surgeon: Laren Boom, DO;  Location: [facility] OR;  Service: ENT;  Laterality: Bilateral;   TONSILLECTOMY     Family History:  Family History  Problem Relation Age of Onset   Healthy Mother    Healthy Father    Family Psychiatric  History: See H&P  Social History:  Social History   Substance and Sexual Activity  Alcohol Use Yes   Comment: occaisonal wine     Social History  Substance and Sexual Activity  Drug Use Yes   Types: Marijuana   Comment: Last use 02/24/22    Social History   Socioeconomic History   Marital status: Single    Spouse name: Not on file   Number of children: Not on file   Years  of education: Not on file   Highest education level: Not on file  Occupational History   Not on file  Tobacco Use   Smoking status: Never   Smokeless tobacco: Never  Vaping Use   Vaping status: Some Days   Substances: THC  Substance and Sexual Activity   Alcohol use: Yes    Comment: occaisonal wine   Drug use: Yes    Types: Marijuana    Comment: Last use 02/24/22   Sexual activity: Yes    Birth control/protection: Pill  Other Topics Concern   Not on file  Social History Narrative   Not on file   Social Drivers of Health   Financial Resource Strain: Not on file  Food Insecurity: Food Insecurity Present (01/18/2024)   Hunger Vital Sign    Worried About Running Out of Food in the Last Year: Never true    Ran Out of Food in the Last Year: Sometimes true  Transportation Needs: No Transportation Needs (01/18/2024)   PRAPARE - Administrator, Civil Service (Medical): No    Lack of Transportation (Non-Medical): No  Physical Activity: Not on file  Stress: Not on file  Social Connections: Patient Unable To Answer (01/18/2024)   Social Connection and Isolation Panel [NHANES]    Frequency of Communication with Friends and Family: Patient unable to answer    Frequency of Social Gatherings with Friends and Family: Patient unable to answer    Attends Religious Services: Patient unable to answer    Active Member of Clubs or Organizations: Patient unable to answer    Attends Banker Meetings: Patient unable to answer    Marital Status: Patient unable to answer   Additional Social History:                          Current Medications: Current Facility-Administered Medications  Medication Dose Route Frequency Provider Last Rate Last Admin   acetaminophen (TYLENOL) tablet 650 mg  650 mg Oral Q6H PRN Eligha Bridegroom, NP       alum & mag hydroxide-simeth (MAALOX/MYLANTA) 200-200-20 MG/5ML suspension 30 mL  30 mL Oral Q4H PRN Eligha Bridegroom, NP        haloperidol (HALDOL) tablet 5 mg  5 mg Oral TID PRN Eligha Bridegroom, NP       And   diphenhydrAMINE (BENADRYL) capsule 50 mg  50 mg Oral TID PRN Eligha Bridegroom, NP       haloperidol lactate (HALDOL) injection 5 mg  5 mg Intramuscular TID PRN Eligha Bridegroom, NP       And   diphenhydrAMINE (BENADRYL) injection 50 mg  50 mg Intramuscular TID PRN Eligha Bridegroom, NP       And   LORazepam (ATIVAN) injection 2 mg  2 mg Intramuscular TID PRN Eligha Bridegroom, NP       haloperidol lactate (HALDOL) injection 10 mg  10 mg Intramuscular TID PRN Eligha Bridegroom, NP       And   diphenhydrAMINE (BENADRYL) injection 50 mg  50 mg Intramuscular TID PRN Eligha Bridegroom, NP       And   LORazepam (ATIVAN) injection 2 mg  2  mg Intramuscular TID PRN Eligha Bridegroom, NP       escitalopram (LEXAPRO) tablet 5 mg  5 mg Oral QHS Ymani Porcher H, NP   5 mg at 01/20/24 2106   feeding supplement (ENSURE ENLIVE / ENSURE PLUS) liquid 237 mL  237 mL Oral BID BM Kinslea Frances H, NP   237 mL at 01/20/24 1037   hydrOXYzine (ATARAX) tablet 25 mg  25 mg Oral TID PRN Eligha Bridegroom, NP   25 mg at 01/20/24 2106   lurasidone (LATUDA) tablet 20 mg  20 mg Oral QHS Sarit Sparano H, NP   20 mg at 01/20/24 2106   magnesium hydroxide (MILK OF MAGNESIA) suspension 30 mL  30 mL Oral Daily PRN Eligha Bridegroom, NP       ondansetron [facility]) tablet 4 mg  4 mg Oral Q8H PRN Sindy Guadeloupe, NP   4 mg at 01/21/24 1610   traZODone (DESYREL) tablet 50 mg  50 mg Oral QHS PRN Eligha Bridegroom, NP   50 mg at 01/20/24 2106    Lab Results:  Results for orders placed or performed during the hospital encounter of 01/18/24 (from the past 48 hours)  Lipid panel     Status: Abnormal   Collection Time: 01/20/24  6:31 AM  Result Value Ref Range   Cholesterol 208 (H) 0 - 200 mg/dL   Triglycerides 47 <960 mg/dL   HDL 85 >45 mg/dL   Total CHOL/HDL Ratio 2.4 RATIO   VLDL 9 0 - 40 mg/dL   LDL Cholesterol 409 (H) 0 - 99 mg/dL     Comment:        Total Cholesterol/HDL:CHD Risk Coronary Heart Disease Risk Table                     Men   Women  1/2 Average Risk   3.4   3.3  Average Risk       5.0   4.4  2 X Average Risk   9.6   7.1  3 X Average Risk  23.4   11.0        Use the calculated Patient Ratio above and the CHD Risk Table to determine the patient's CHD Risk.        ATP III CLASSIFICATION (LDL):  <100     mg/dL   Optimal  811-914  mg/dL   Near or Above                    Optimal  130-159  mg/dL   Borderline  782-956  mg/dL   High  >213     mg/dL   Very High Performed at [facility], [address] [place], [place] [address]   Hemoglobin A1c     Status: None   Collection Time: 01/20/24  6:31 AM  Result Value Ref Range   Hgb A1c MFr Bld 5.6 4.8 - 5.6 %    Comment: (NOTE)         Prediabetes: 5.7 - 6.4         Diabetes: >6.4         Glycemic control for adults with diabetes: <7.0    Mean Plasma Glucose 114 mg/dL    Comment: (NOTE) Performed At: [facility] [address] [place], [place] [id number] Jolene Schimke MD WU:[id number]   VITAMIN D 25 Hydroxy (Vit-D Deficiency, Fractures)     Status: None   Collection Time: 01/20/24  6:31 AM  Result Value Ref  Range   Vit D, 25-Hydroxy 33.60 30 - 100 ng/mL    Comment: (NOTE) Vitamin D deficiency has been defined by the Institute of Medicine  and an Endocrine Society practice guideline as a level of serum 25-OH  vitamin D less than 20 ng/mL (1,2). The Endocrine Society went on to  further define vitamin D insufficiency as a level between 21 and 29  ng/mL (2).  1. IOM (Institute of Medicine). 2010. Dietary reference intakes for  calcium and D. [place]: [employer]. 2. Holick MF, Binkley Aber[place], Bischoff-Ferrari HA, et al. Evaluation,  treatment, and prevention of vitamin D deficiency: an Endocrine  Society clinical practice guideline, JCEM. 2011 Jul; 96(7): 1911-30.  Performed at [facility], [address], [place], [place] [address]   TSH     Status: None   Collection Time: 01/20/24  6:31 AM  Result Value Ref Range   TSH 1.349 0.350 - 4.500 uIU/mL    Comment: Performed by a 3rd Generation assay with a functional sensitivity of <=0.01 uIU/mL. Performed at [facility], [address] [place], [place] [address]     Blood Alcohol level:  Lab Results  Component Value Date   ETH <10 01/18/2024    Metabolic Disorder Labs: Lab Results  Component Value Date   HGBA1C 5.6 01/20/2024   MPG 114 01/20/2024   No results found for: "PROLACTIN" Lab Results  Component Value Date   CHOL 208 (H) 01/20/2024   TRIG 47 01/20/2024   HDL 85 01/20/2024   CHOLHDL 2.4 01/20/2024   VLDL 9 01/20/2024   LDLCALC 114 (H) 01/20/2024    Physical Findings: AIMS:  , , 0 ,  ,    CIWA:   N/A COWS:   N/A  Musculoskeletal: Strength & Muscle Tone: within normal limits Gait & Station: normal Patient leans: N/A  Psychiatric Specialty Exam:  Presentation  General Appearance:  Casual  Eye Contact: Good  Speech: Clear and Coherent  Speech Volume: Normal  Handedness: -- (Did not assess)   Mood and Affect  Mood: Anxious; Depressed; Hopeless; Worthless  Affect: Solicitor Processes: Coherent; Goal Directed  Descriptions of Associations:Intact  Orientation:Full (Time, Place and Person)  Thought Content:Logical  History of Schizophrenia/Schizoaffective disorder:No data recorded Duration of Psychotic Symptoms:No data recorded Hallucinations:No data recorded  Ideas of Reference:None  Suicidal Thoughts:Suicidal Thoughts: No SI Passive Intent and/or Plan: -- (Denies)  Homicidal Thoughts:No data recorded   Sensorium  Memory: Immediate Fair; Recent Fair  Judgment: Impaired  Insight: Lacking   Executive Functions  Concentration: Fair  Attention Span: Fair  Recall: [employer] of  Knowledge: Fair  Language: Fair   Psychomotor Activity  Psychomotor Activity: No data recorded   Assets  Assets: Resilience   Sleep  Sleep: No data recorded    Physical Exam: Physical Exam Vitals and nursing note reviewed.  Constitutional:      General: She is not in acute distress.    Appearance: Normal appearance. She is not ill-appearing.  Cardiovascular:     Rate and Rhythm: Bradycardia present.  Pulmonary:     Effort: No respiratory distress.     Comments: Pulse rate 56 Neurological:     Mental Status: She is alert and oriented to person, place, and time.    Review of Systems  Constitutional: Negative.   Respiratory:  Negative for shortness of breath.   Cardiovascular:  Negative for chest pain and palpitations.  Gastrointestinal:  Negative for constipation, diarrhea, nausea and vomiting.  Musculoskeletal:  Negative for falls.  Skin: Negative.   Neurological:  Negative for dizziness, tingling, tremors and headaches.  Psychiatric/Behavioral:  Positive for depression and substance abuse. Negative for hallucinations and suicidal ideas. The patient is nervous/anxious. The patient does not have insomnia.    Blood pressure 113/88, pulse 88, temperature 98 F (36.7 C), temperature source Oral, resp. rate 14, height 5\' 2"  (1.575 m), weight 57.7 kg, last menstrual period 01/11/2024, SpO2 100%. Body mass index is 23.27 kg/m.   Treatment Plan Summary: Daily contact with patient to assess and evaluate symptoms and progress in treatment and Medication management  ASSESSMENT: 23 year old female patient presents with significant biological factors impacting her mental health. She reports a history of depressive symptoms including hopelessness, worthlessness, disturbed sleep, anhedonia, and decreased appetite. She also experiences anxiety symptoms such as excessive worrying, restlessness, and panic attacks. She has a history of concussion and daily cannabis use, which may  contribute to her mental health challenges. The patient exhibits traits of borderline personality disorder, including fear of abandonment, unstable relationships, and impulsivity. She struggles with intense anger outbursts and labile mood. She is not currently on any medications but has consented to pharmacologic intervention.   Diagnoses / Active Problems: Principal Problem:   MDD (major depressive disorder) Active Problems:   Borderline personality disorder (HCC)   Generalized anxiety disorder   Cannabis use disorder, severe, dependence (HCC)   Tobacco use disorder              PLAN: Safety and Monitoring:  -- Voluntary admission to inpatient psychiatric unit for safety, stabilization and treatment -- Daily contact with patient to assess and evaluate symptoms and progress in treatment             -- Patient's case to be discussed in multi-disciplinary team meeting             -- Observation Level : q15 minute checks             -- Vital signs:  q12 hours             -- Precautions: suicide, elopement, and assault   2. Psychiatric Diagnoses and Treatment:    #MDD  Borderline Personality Disorder  GAD -- Continue Lexapro 5 mg daily at bedtime -- Increase lurasidone from 20 mg to 40 mg daily at bedtime with food    --  The risks/benefits/side-effects/alternatives to this medication were discussed in detail with the patient and time was given for questions. The patient consents to medication trial.  -- FDA -- Metabolic profile and EKG monitoring obtained while on an atypical antipsychotic (BMI: Lipid Panel: HbgA1c: QTc:)    #Continue As Needed Medications --Hydroxyzine 25 mg oral, 3 times daily as needed, anxiety --Trazodone 50 mg, oral, daily at bedtime as needed, sleep   #Continue BH Agitation Protocol --Haldol 5 mg, oral, 3 times daily as needed, mild agitation --Benadryl 50 mg, oral, 3 times daily as needed, mild agitation                                     OR  --Haldol  injection 5 mg, IM, 3 times daily as needed, moderate agitation --Benadryl injection 50 mg, IM, 3 times daily as needed, moderate agitation --Ativan injection 2 mg, IM, 3 times daily as needed, moderate agitation  OR --Haldol injection 10 mg, IM, 3 times daily as needed, severe agitation --Benadryl injection 50 mg, IM, 3 times daily as needed, severe agitation --Ativan injection 2 mg, IM, 3 times daily as needed, severe agitation     -- Encouraged patient to participate in unit milieu and in scheduled group therapies  -- Short Term Goals: Ability to identify changes in lifestyle to reduce recurrence of condition will improve, Ability to verbalize feelings will improve, Ability to disclose and discuss suicidal ideas, Ability to demonstrate self-control will improve, Ability to identify and develop effective coping behaviors will improve, Ability to maintain clinical measurements within normal limits will improve, Compliance with prescribed medications will improve, and Ability to identify triggers associated with substance abuse/mental health issues will improve -- Long Term Goals: Improvement in symptoms so as ready for discharge                3. Medical Issues Being Addressed:               #Tobacco Use Disorder -- Declined Nicotine patch              -- Smoking cessation encouraged   #Continue As Needed Medications --Tylenol 650 mg every 6 hours, as needed, mild pain, fever --Maalox/Mylanta 30 mL oral every 4 hours as needed, indigestion --Milk of Magnesia 30 mL oral daily as needed, mild constipation   4. Labs    CMP: Glucose slightly elevated at 101 CBC: WBC elevated at 13.3              Labs Ordered for 01/20/24 Reviewed: Hemoglobin A1c: 5.6  Lipid Panel: Cholesterol ^ 208  LDL^114 TSH: Within range  Vitamin D: Within range   UDS: + THC    EKG: Sinus bradycardia  QT 390/QTc 365     5. Discharge Planning:  -- Social work and case  management to assist with discharge planning and identification of hospital follow-up needs prior to discharge             -- Estimated LOS: 5-7 days             -- Discharge Concerns: Need to establish a safety plan; Medication compliance and effectiveness             -- Discharge Goals: Return home with outpatient referrals for mental health follow-up including medication management/psychotherapy       I certify that inpatient services furnished can reasonably be expected to improve the patient's condition.     Norma Fredrickson, NP 01/21/2024, 8:39 AM Patient ID: Cynthia Moran, female   DOB: 2001-01-06, 23 y.o.   MRN: [id number]

## 2024-01-21 NOTE — Progress Notes (Signed)
   01/21/24 1550  Spiritual Encounters  Type of Visit Initial  Care provided to: Patient  Referral source Patient request  Reason for visit Urgent spiritual support  Spiritual Framework  Presenting Themes Rituals and practive;Impactful experiences and emotions  Patient Stress Factors Family relationships  Family Stress Factors Other (Comment) (father stage IV cancer)   Per request of Ms. Cynthia Moran, I provided spiritual care support.  [name] debriefed with me at length to describe not only the concern for her father (stage IV cancer) but the complex family dynamics that surround his care needs and that impact upon Cynthia Moran. She further processed both current anticipatory grief feelings as well as the recall of other themes of grief and loss. These include loss of her mother (had stroke when Cynthia Moran was 4, and subsequent life changes and loss of childhood freedoms growing up with stepmom who has now reinserted herself into the frame).  Of note were issues of ethical concern regarding care of father and Cynthia Moran's role as eldest child, feelings of responsibility and uncertainty.  I provided compassionate presence, offering both active and reflective listening. I facilitated reflection on forms of loss and naming sources of grief. I invited ways to frame alternate views of the situation and this facilitated greater clarity. I also engaged Cynthia Moran in her meaning making, including faith and providing prayer at [facility] request. Will locate and connect her to grief and caregiving support resources also at her request.  Cynthia Moran L. Sophronia Simas, M.Div [phone]

## 2024-01-21 NOTE — Progress Notes (Signed)
 Patient is responding well to treatment.  Her mood is stabilizing appropriately.  We will optimize lurasidone to 40 mg tonight.  We will keep her SSRI at the same dose.  Hopeful discharge by Sunday if she maintains progress.

## 2024-01-21 NOTE — BH Assessment (Signed)
 Patient alert and oriented, up walking around at beginning of shift. Denies any SI today. States things have improved. Denies HI and AVH, Attended group and had snack without problems. Pt came to window to receive meds and went to bed. Pt did request prn for sleep and nausea. Medications worked, pt resting well with eyes closed.

## 2024-01-21 NOTE — [facility] Suicide Risk Assessment (Signed)
 [facility] INPATIENT:  Family/Significant Other Suicide Prevention Education  Suicide Prevention Education:  Patient Refusal for Family/Significant Other Suicide Prevention Education: The patient Cynthia Moran has refused to provide written consent for family/significant other to be provided Family/Significant Other Suicide Prevention Education during admission and/or prior to discharge.  Physician notified.  Elza Rafter 01/21/2024, 2:29 PM

## 2024-01-21 NOTE — Group Note (Signed)
 Recreation Therapy Group Note   Group Topic:Leisure Education  Group Date: 01/21/2024 Start Time: 0930 End Time: 0955 Facilitators: Wilkins Elpers-McCall, LRT,CTRS Location: [address] Dayroom  Group Topic: Leisure Education  Goal Area(s) Addresses:  Patient will identify positive leisure activities for use post discharge. Patient will identify at least one positive benefit of participation in leisure activities.  Patient will work effectively work with peers in making sure activity goes as planned.  Intervention: [facility]   Activity: Patients were placed in a circle in the dayroom and given a beach ball. Patients were to play a game where they were to keep the ball moving at all times. LRT would time the group as they play the game. If the ball were to come to a stop, the time would start over.  Education:  Leisure Scientist, physiological, Special educational needs teacher, Teamwork, Discharge Planning  Education Outcome: Acknowledges education/In group clarification offered/Needs additional education.    Affect/Mood: Appropriate   Participation Level: Engaged   Participation Quality: Independent   Behavior: Appropriate   Speech/Thought Process: Focused   Insight: Good   Judgement: Good   Modes of Intervention: Cooperative Play   Patient Response to Interventions:  Engaged   Education Outcome:  In group clarification offered    Clinical Observations/Individualized Feedback: Pt was bright and engaged. Pt worked well with peers in being successful with the activity. Pt also made suggestions as play went on to help the group be more successful.    Plan: Continue to engage patient in RT group sessions 2-3x/week.   Tymar Polyak-McCall, LRT,CTRS 01/21/2024 12:13 PM

## 2024-01-21 NOTE — Group Note (Signed)
 Date:  01/21/2024 Time:  8:40 AM  Group Topic/Focus:  Emotional Education:   The focus of this group is to discuss what feelings/emotions are, and how they are experienced.    Participation Level:  Active  Participation Quality:  Appropriate  Affect:  Appropriate   Erasmo Score 01/21/2024, 8:40 AM
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
## 2024-01-22 DIAGNOSIS — F332 Major depressive disorder, recurrent severe without psychotic features: Secondary | ICD-10-CM | POA: Diagnosis not present

## 2024-01-22 NOTE — Plan of Care (Signed)
   Problem: Education: Goal: Knowledge of [facility] Education information/materials will improve Outcome: Progressing Goal: Emotional status will improve Outcome: Progressing Goal: Mental status will improve Outcome: Progressing Goal: Verbalization of understanding the information provided will improve Outcome: Progressing

## 2024-01-22 NOTE — Progress Notes (Signed)
   01/22/24 0915  Psych Admission Type (Psych Patients Only)  Admission Status Voluntary  Psychosocial Assessment  Patient Complaints Anxiety;Depression  Eye Contact Fair  Facial Expression Animated  Affect Appropriate to circumstance  Speech Logical/coherent  Interaction Assertive  Motor Activity Other (Comment) (WNL)  Appearance/Hygiene Unremarkable  Behavior Characteristics Appropriate to situation;Cooperative  Mood Depressed;Anxious  Thought Process  Coherency WDL  Content WDL  Delusions None reported or observed  Perception WDL  Hallucination None reported or observed  Judgment Poor  Confusion None  Danger to Self  Current suicidal ideation? Denies  Agreement Not to Harm Self Yes  Description of Agreement Verbal  Danger to Others  Danger to Others None reported or observed

## 2024-01-22 NOTE — Progress Notes (Addendum)
 [facility] MD Progress Note  01/22/2024 1:20 PM Cynthia Moran  MRN:  [id number]  Principal Problem: MDD (major depressive disorder) Diagnosis: Principal Problem:   MDD (major depressive disorder) Active Problems:   Borderline personality disorder (HCC)   Generalized anxiety disorder   Cannabis use disorder, severe, dependence (HCC)   Tobacco use disorder  Chart review from last 24 hours: Chart reviewed today. Patient discussed at multidisciplinary team meeting.  Vital signs without critical values documented sleep hours: 8.25. Nursing staff reported the patient received Zofran due to complaints of nausea and vomiting; denies suicidal or homicidal ideation and psychotic symptoms. She is compliant with routine psychotropic medication regimen without difficulty. Required as needed hydroxyzine, trazodone last night, Zofran for nausea and Tylenol this morning for mild pain, according to the nursing record.    Yesterday the psychiatry team made the following recommendations: - Continue Lexapro 5 mg daily at bedtime -- Increase lurasidone from 20 mg to 40 mg daily at bedtime with food   Today's assessment notes: On assessment today, the patient reports her mood is less depressed and rates depression as number is 0/10 with 10 being high severity.  She reports that Latuda and Lexapro have greatly improved her mood.  She reports that her major stressor is caring for her dying father with stage IV cancer  who is not responding to chemotherapy and radiation.  Report being compliant with her psychotropic medications.  No signs of EPS, cogwheel rigidity, or any other movement disorders observed during this assessment.  She denies delusional thinking or paranoia.  Further denies SI, HI, or AVH.  Reports our goal is to continue to participate in therapeutic milieu and also unit group activities.  She outlined her short-term goals after discharge, to  include adhering to her prescribed medication regimen and continuing  therapy, especially grief therapy.  She also shared that her spouse has become more supportive since her admission and is now allowing her to make her own decisions.  She reports that he even suggested couples therapy, which she viewed as a positive development.     Reports that anxiety is at manageable level Sleep is improved Appetite is better and continues on Ensure while nutritional supplement twice daily Concentration is better Energy level is adequate Denies suicidal thoughts.  Denied suicidal intent and plan.  Denies having any HI.  Denies having psychotic symptoms.   Denies having side effects to current psychiatric medications.   We discussed compliance to current medication regimen.  Discussed the following psychosocial stressors: Recommend participating in grief therapy and family therapy  The case was reviewed with the attending psychiatrist, Dr.Pashayan.  Patient continues on lurasidone 20 mg at 40 mg by the attending psychiatrist. Tentative discharge planned for Sunday, April 13, contingent on continued progress and stability.   Time spent with patient: 45 minutes  Past Psychiatric History: See H&P   Past Medical History:  Past Medical History:  Diagnosis Date   ADHD (attention deficit hyperactivity disorder)    ADHD   Anxiety    Bipolar disorder (HCC)    Depression     Past Surgical History:  Procedure Laterality Date   CESAREAN SECTION     CLOSED REDUCTION NASAL FRACTURE Bilateral 02/27/2022   Procedure: CLOSED REDUCTION NASAL FRACTURE;  Surgeon: Daleen Dubs, DO;  Location: [facility] OR;  Service: ENT;  Laterality: Bilateral;   TONSILLECTOMY     Family History:  Family History  Problem Relation Age of Onset   Healthy Mother    Healthy  Father    Family Psychiatric  History: See H&P  Social History:  Social History   Substance and Sexual Activity  Alcohol Use Yes   Comment: occaisonal wine     Social History   Substance and Sexual Activity  Drug  Use Yes   Types: Marijuana   Comment: Last use 02/24/22    Social History   Socioeconomic History   Marital status: Single    Spouse name: Not on file   Number of children: Not on file   Years of education: Not on file   Highest education level: Not on file  Occupational History   Not on file  Tobacco Use   Smoking status: Never   Smokeless tobacco: Never  Vaping Use   Vaping status: Some Days   Substances: THC  Substance and Sexual Activity   Alcohol use: Yes    Comment: occaisonal wine   Drug use: Yes    Types: Marijuana    Comment: Last use 02/24/22   Sexual activity: Yes    Birth control/protection: Pill  Other Topics Concern   Not on file  Social History Narrative   Not on file   Social Drivers of Health   Financial Resource Strain: Not on file  Food Insecurity: Food Insecurity Present (01/18/2024)   Hunger Vital Sign    Worried About Running Out of Food in the Last Year: Never true    Ran Out of Food in the Last Year: Sometimes true  Transportation Needs: No Transportation Needs (01/18/2024)   PRAPARE - Administrator, Civil Service (Medical): No    Lack of Transportation (Non-Medical): No  Physical Activity: Not on file  Stress: Not on file  Social Connections: Patient Unable To Answer (01/18/2024)   Social Connection and Isolation Panel [NHANES]    Frequency of Communication with Friends and Family: Patient unable to answer    Frequency of Social Gatherings with Friends and Family: Patient unable to answer    Attends Religious Services: Patient unable to answer    Active Member of Clubs or Organizations: Patient unable to answer    Attends Banker Meetings: Patient unable to answer    Marital Status: Patient unable to answer   Additional Social History:    Current Medications: Current Facility-Administered Medications  Medication Dose Route Frequency Provider Last Rate Last Admin   acetaminophen (TYLENOL) tablet 650 mg  650 mg Oral  Q6H PRN Roise Cleaver, NP   650 mg at 01/22/24 0639   alum & mag hydroxide-simeth (MAALOX/MYLANTA) 200-200-20 MG/5ML suspension 30 mL  30 mL Oral Q4H PRN Roise Cleaver, NP       haloperidol (HALDOL) tablet 5 mg  5 mg Oral TID PRN Roise Cleaver, NP       And   diphenhydrAMINE (BENADRYL) capsule 50 mg  50 mg Oral TID PRN Roise Cleaver, NP       haloperidol lactate (HALDOL) injection 5 mg  5 mg Intramuscular TID PRN Roise Cleaver, NP       And   diphenhydrAMINE (BENADRYL) injection 50 mg  50 mg Intramuscular TID PRN Roise Cleaver, NP       And   LORazepam (ATIVAN) injection 2 mg  2 mg Intramuscular TID PRN Roise Cleaver, NP       haloperidol lactate (HALDOL) injection 10 mg  10 mg Intramuscular TID PRN Roise Cleaver, NP       And   diphenhydrAMINE (BENADRYL) injection 50 mg  50 mg Intramuscular TID  PRN Roise Cleaver, NP       And   LORazepam (ATIVAN) injection 2 mg  2 mg Intramuscular TID PRN Roise Cleaver, NP       escitalopram (LEXAPRO) tablet 5 mg  5 mg Oral QHS Bennett, Christal H, NP   5 mg at 01/21/24 2127   feeding supplement (ENSURE ENLIVE / ENSURE PLUS) liquid 237 mL  237 mL Oral BID BM Bennett, Christal H, NP   237 mL at 01/22/24 1047   hydrOXYzine (ATARAX) tablet 25 mg  25 mg Oral TID PRN Roise Cleaver, NP   25 mg at 01/21/24 2127   lurasidone (LATUDA) tablet 40 mg  40 mg Oral QHS Izediuno, Iline Mallory, MD   40 mg at 01/21/24 2127   magnesium hydroxide (MILK OF MAGNESIA) suspension 30 mL  30 mL Oral Daily PRN Roise Cleaver, NP       ondansetron (ZOFRAN) tablet 4 mg  4 mg Oral Q8H PRN Dorthea Gauze, NP   4 mg at 01/21/24 0634   traZODone (DESYREL) tablet 50 mg  50 mg Oral QHS PRN Roise Cleaver, NP   50 mg at 01/21/24 2127    Lab Results:  No results found for this or any previous visit (from the past 48 hours).  Blood Alcohol level:  Lab Results  Component Value Date   ETH <10 01/18/2024    Metabolic Disorder Labs: Lab Results  Component  Value Date   HGBA1C 5.6 01/20/2024   MPG 114 01/20/2024   No results found for: "PROLACTIN" Lab Results  Component Value Date   CHOL 208 (H) 01/20/2024   TRIG 47 01/20/2024   HDL 85 01/20/2024   CHOLHDL 2.4 01/20/2024   VLDL 9 01/20/2024   LDLCALC 114 (H) 01/20/2024   Physical Findings: AIMS:  , , 0 ,  ,    CIWA:   N/A COWS:   N/A  Musculoskeletal: Strength & Muscle Tone: within normal limits Gait & Station: normal Patient leans: N/A  Psychiatric Specialty Exam:  Presentation  General Appearance:  Casual  Eye Contact: Good  Speech: Clear and Coherent  Speech Volume: Normal  Handedness: -- (Did not assess)  Mood and Affect  Mood: Anxious; Depressed; Hopeless; Worthless  Affect: Librarian, academic Processes: Coherent; Goal Directed  Descriptions of Associations:Intact  Orientation:Full (Time, Place and Person)  Thought Content:Logical  History of Schizophrenia/Schizoaffective disorder:No data recorded Duration of Psychotic Symptoms:No data recorded Hallucinations:No data recorded  Ideas of Reference:None  Suicidal Thoughts:No data recorded  Homicidal Thoughts:No data recorded  Sensorium  Memory: Immediate Fair; Recent Fair  Judgment: Impaired  Insight: Lacking  Executive Functions  Concentration: Fair  Attention Span: Fair  Recall: [employer] of Knowledge: Fair  Language: Fair  Psychomotor Activity  Psychomotor Activity: No data recorded  Assets  Assets: Resilience  Sleep  Sleep: No data recorded  Physical Exam: Physical Exam Vitals and nursing note reviewed.  Constitutional:      General: She is not in acute distress.    Appearance: Normal appearance. She is not ill-appearing or toxic-appearing.  HENT:     Right Ear: External ear normal.     Left Ear: External ear normal.     Mouth/Throat:     Mouth: Mucous membranes are moist.     Pharynx: Oropharynx is clear.  Eyes:      Extraocular Movements: Extraocular movements intact.  Cardiovascular:     Rate and Rhythm: Normal rate.  Pulmonary:     Effort:  Pulmonary effort is normal. No respiratory distress.     Comments: Pulse rate 56 Abdominal:     Comments: Deferred  Genitourinary:    Comments: Deferred Musculoskeletal:        General: Normal range of motion.     Cervical back: Normal range of motion.  Skin:    General: Skin is warm.  Neurological:     General: No focal deficit present.     Mental Status: She is alert and oriented to person, place, and time.  Psychiatric:        Mood and Affect: Mood normal.        Behavior: Behavior normal.        Thought Content: Thought content normal.    Review of Systems  Constitutional: Negative.   HENT:  Negative for sore throat.   Eyes:  Negative for blurred vision.  Respiratory:  Negative for shortness of breath.   Cardiovascular:  Negative for chest pain and palpitations.  Gastrointestinal:  Negative for constipation, diarrhea, nausea and vomiting.  Musculoskeletal:  Negative for falls.  Skin: Negative.   Neurological:  Negative for dizziness, tingling, tremors and headaches.  Psychiatric/Behavioral:  Positive for depression. Negative for hallucinations, substance abuse and suicidal ideas. The patient is nervous/anxious. The patient does not have insomnia.    Blood pressure 116/81, pulse 66, temperature (!) 97.5 F (36.4 C), temperature source Oral, resp. rate 16, height 5\' 2"  (1.575 m), weight 57.7 kg, last menstrual period 01/11/2024, SpO2 96%. Body mass index is 23.27 kg/m.  Treatment Plan Summary: Daily contact with patient to assess and evaluate symptoms and progress in treatment and Medication management  ASSESSMENT: 23 year old female patient presents with significant biological factors impacting her mental health. She reports a history of depressive symptoms including hopelessness, worthlessness, disturbed sleep, anhedonia, and decreased  appetite. She also experiences anxiety symptoms such as excessive worrying, restlessness, and panic attacks. She has a history of concussion and daily cannabis use, which may contribute to her mental health challenges. The patient exhibits traits of borderline personality disorder, including fear of abandonment, unstable relationships, and impulsivity. She struggles with intense anger outbursts and labile mood. She is not currently on any medications but has consented to pharmacologic intervention.   Diagnoses / Active Problems: Principal Problem:   MDD (major depressive disorder) Active Problems:   Borderline personality disorder (HCC)   Generalized anxiety disorder   Cannabis use disorder, severe, dependence (HCC)   Tobacco use disorder              PLAN: Safety and Monitoring:  -- Voluntary admission to inpatient psychiatric unit for safety, stabilization and treatment -- Daily contact with patient to assess and evaluate symptoms and progress in treatment             -- Patient's case to be discussed in multi-disciplinary team meeting             -- Observation Level : q15 minute checks             -- Vital signs:  q12 hours             -- Precautions: suicide, elopement, and assault   2. Psychiatric Diagnoses and Treatment:    #MDD  Borderline Personality Disorder  GAD -- Continue Lexapro 5 mg daily at bedtime -- Continue lurasidone: 1 2 40 mg daily at bedtime with food    --  The risks/benefits/side-effects/alternatives to this medication were discussed in detail with the patient and time was given  for questions. The patient consents to medication trial.  -- FDA -- Metabolic profile and EKG monitoring obtained while on an atypical antipsychotic (BMI: Lipid Panel: HbgA1c: QTc:)    #Continue As Needed Medications --Hydroxyzine 25 mg oral, 3 times daily as needed, anxiety --Trazodone 50 mg, oral, daily at bedtime as needed, sleep   #Continue BH Agitation Protocol --Haldol 5 mg,  oral, 3 times daily as needed, mild agitation --Benadryl 50 mg, oral, 3 times daily as needed, mild agitation                                     OR  --Haldol injection 5 mg, IM, 3 times daily as needed, moderate agitation --Benadryl injection 50 mg, IM, 3 times daily as needed, moderate agitation --Ativan injection 2 mg, IM, 3 times daily as needed, moderate agitation                                     OR --Haldol injection 10 mg, IM, 3 times daily as needed, severe agitation --Benadryl injection 50 mg, IM, 3 times daily as needed, severe agitation --Ativan injection 2 mg, IM, 3 times daily as needed, severe agitation     -- Encouraged patient to participate in unit milieu and in scheduled group therapies  -- Short Term Goals: Ability to identify changes in lifestyle to reduce recurrence of condition will improve, Ability to verbalize feelings will improve, Ability to disclose and discuss suicidal ideas, Ability to demonstrate self-control will improve, Ability to identify and develop effective coping behaviors will improve, Ability to maintain clinical measurements within normal limits will improve, Compliance with prescribed medications will improve, and Ability to identify triggers associated with substance abuse/mental health issues will improve -- Long Term Goals: Improvement in symptoms so as ready for discharge                3. Medical Issues Being Addressed:               #Tobacco Use Disorder -- Declined Nicotine patch              -- Smoking cessation encouraged   #Continue As Needed Medications --Tylenol 650 mg every 6 hours, as needed, mild pain, fever --Maalox/Mylanta 30 mL oral every 4 hours as needed, indigestion --Milk of Magnesia 30 mL oral daily as needed, mild constipation   4. Labs    CMP: Glucose slightly elevated at 101 CBC: WBC elevated at 13.3              Labs Ordered for 01/20/24 Reviewed: Hemoglobin A1c: 5.6  Lipid Panel: Cholesterol ^ 208   LDL^114 TSH: Within range  Vitamin D: Within range   UDS: + THC    EKG: Sinus bradycardia  QT 390/QTc 365     5. Discharge Planning:  -- Social work and case management to assist with discharge planning and identification of hospital follow-up needs prior to discharge             -- Estimated LOS: 5-7 days             -- Discharge Concerns: Need to establish a safety plan; Medication compliance and effectiveness             -- Discharge Goals: Return home with outpatient referrals for mental health follow-up including medication management/psychotherapy  I certify that inpatient services furnished can reasonably be expected to improve the patient's condition.     Laurence Pons, FNP 01/22/2024, 1:20 PM Patient ID: Cynthia Moran, female   DOB: [birth year], 23 y.o.   MRN: [id number] Patient ID: Taffie Eckmann, female   DOB: 2001/07/30, 23 y.o.   MRN: [id number]

## 2024-01-22 NOTE — Group Note (Signed)
 Date:  01/22/2024 Time:  9:21 AM  Group Topic/Focus:  Overcoming Stress:   The focus of this group is to define stress and help patients assess their triggers.    Participation Level:  Active  Participation Quality:  Appropriate  Affect:  Appropriate  [name] 01/22/2024, 9:21 AM

## 2024-01-22 NOTE — Plan of Care (Signed)
   Problem: Activity: Goal: Interest or engagement in activities will improve Outcome: Progressing Goal: Sleeping patterns will improve Outcome: Progressing   Problem: Coping: Goal: Ability to verbalize frustrations and anger appropriately will improve Outcome: Progressing Goal: Ability to demonstrate self-control will improve Outcome: Progressing   Problem: Safety: Goal: Periods of time without injury will increase Outcome: Progressing   Problem: Physical Regulation: Goal: Ability to maintain clinical measurements within normal limits will improve Outcome: Progressing

## 2024-01-22 NOTE — Group Note (Signed)
 Date:  01/22/2024 Time:  2:04 PM  Group Topic/Focus:  Wellness Toolbox:   The focus of this group is to discuss various aspects of wellness, balancing those aspects and exploring ways to increase the ability to experience wellness.  Patients will create a wellness toolbox for use upon discharge.    Participation Level:  Active  Participation Quality:  Appropriate  Affect:  Appropriate  [name] 01/22/2024, 2:04 PM

## 2024-01-22 NOTE — Plan of Care (Signed)
   Problem: Education: Goal: Knowledge of [facility] Education information/materials will improve Outcome: Progressing Goal: Emotional status will improve Outcome: Progressing Goal: Mental status will improve Outcome: Progressing Goal: Verbalization of understanding the information provided will improve Outcome: Progressing   Problem: Activity: Goal: Interest or engagement in activities will improve Outcome: Progressing Goal: Sleeping patterns will improve Outcome: Progressing   Problem: Coping: Goal: Ability to verbalize frustrations and anger appropriately will improve Outcome: Progressing Goal: Ability to demonstrate self-control will improve Outcome: Progressing   Problem: Health Behavior/Discharge Planning: Goal: Identification of resources available to assist in meeting health care needs will improve Outcome: Progressing

## 2024-01-22 NOTE — Group Note (Signed)
 Date:  01/22/2024 Time:  9:10 PM  Group Topic/Focus:  Wrap-Up Group:   The focus of this group is to help patients review their daily goal of treatment and discuss progress on daily workbooks.    Participation Level:  Active  Participation Quality:  Appropriate  Affect:  Appropriate  Cognitive:  Appropriate  Insight: Appropriate  Engagement in Group:  Engaged  Modes of Intervention:  Socialization  Additional Comments:  Pt attended the evening wrap-up group. Tech introduced the staff for the evening, reminded group of the evening schedule and reminded them to ask for anything they need. PT played a game to support memory and socialization. Pt encouraged other pts, cheering them on during game play.  Carmon Christen 01/22/2024, 9:10 PM

## 2024-01-23 DIAGNOSIS — F332 Major depressive disorder, recurrent severe without psychotic features: Secondary | ICD-10-CM | POA: Diagnosis not present

## 2024-01-23 MED ORDER — ESCITALOPRAM OXALATE 5 MG PO TABS: 5.0000 mg | ORAL_TABLET | Freq: Every day | ORAL | 0 refills | Status: DC

## 2024-01-23 MED ORDER — LURASIDONE HCL 40 MG PO TABS: 40.0000 mg | ORAL_TABLET | Freq: Every day | ORAL | 0 refills | Status: DC

## 2024-01-23 MED ORDER — TRAZODONE HCL 50 MG PO TABS: 50.0000 mg | ORAL_TABLET | Freq: Every evening | ORAL | 0 refills | Status: DC | PRN

## 2024-01-23 MED ORDER — HYDROXYZINE HCL 25 MG PO TABS: 25.0000 mg | ORAL_TABLET | Freq: Three times a day (TID) | ORAL | 0 refills | Status: DC | PRN
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
## 2024-01-23 NOTE — Plan of Care (Signed)
   Problem: Education: Goal: Emotional status will improve Outcome: Progressing Goal: Mental status will improve Outcome: Progressing   Problem: Activity: Goal: Interest or engagement in activities will improve Outcome: Progressing Goal: Sleeping patterns will improve Outcome: Progressing

## 2024-01-23 NOTE — Progress Notes (Signed)
  [facility] Adult Case Management Discharge Plan :  Will you be returning to the same living situation after discharge:  Yes,  The patient share that she will be going home At discharge, do you have transportation home?: Yes,  The patient share that her mother/ baby's father will be picking her up Do you have the ability to pay for your medications: Yes,  The patient share that she insurance  Release of information consent forms completed and in the chart;  Patient's signature needed at discharge.  Patient to Follow up at:  Follow-up Information     [facility] Follow up.   Specialty: Behavioral Health Why: You may also go on Monday through Friday, arrive by 7:00 am for an assessment for medication management services. Contact information: [address] [place]  [address] [phone]        [facility] Go on 01/26/2024.   Specialty: Behavioral Health Why: You have an assessment appointment on 01/26/24 at 1:00 pm, in person. At this time, you will be scheduled for therapy services. You also have an appointment on 02/02/24 at 6:00 pm for medication management services. Contact information: [address] [place] [address] [phone]         [facility]. Schedule an appointment as soon as possible for a visit.   Specialty: Hospice and Palliative Medicine Why: Per provider request, please call to personally schedule an appointment for grief/bereavement therapy services. Contact information: [address] [facility] [place]  [address] [phone]                Next level of care provider has access to [facility] Link:no  Safety Planning and Suicide Prevention discussed: Yes,  CSW spoke to patient about safety planning  at discharge. The patient acknowledge and agreed.      Has patient been referred to the Quitline?: Patient refused referral for treatment  Patient has been referred  for addiction treatment: No known substance use disorder.  Jamil Armwood O Jamarea Selner, LCSWA 01/23/2024, 11:00 AM

## 2024-01-23 NOTE — Progress Notes (Signed)
   01/23/24 0900  Psych Admission Type (Psych Patients Only)  Admission Status Voluntary  Psychosocial Assessment  Patient Complaints None  Eye Contact Fair  Facial Expression Animated  Affect Appropriate to circumstance  Speech Logical/coherent  Interaction Assertive  Motor Activity Other (Comment) (WDL)  Appearance/Hygiene Unremarkable  Behavior Characteristics Appropriate to situation  Mood Anxious  Thought Process  Coherency WDL  Content WDL  Delusions None reported or observed  Perception WDL  Hallucination None reported or observed  Judgment Poor  Confusion None  Danger to Self  Current suicidal ideation? Denies  Agreement Not to Harm Self Yes  Description of Agreement Verbal  Danger to Others  Danger to Others None reported or observed
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
## 2024-01-23 NOTE — BH Assessment (Signed)
 Patient alert and oriented at beginning of shift. Attended group and had snack with other patients. Denies SI/HI, reported anxiety at a 3 and no depression. Pt states she feels better and more positive about everything. Also states she feels like she has more support now. 15 min checks maintained

## 2024-01-23 NOTE — [facility] Suicide Risk Assessment (Signed)
 [facility] INPATIENT:  Family/Significant Other Suicide Prevention Education  Suicide Prevention Education:  Education Completed; Phil Corti,  (patient) has been identified by the patient as the family member/significant other with whom the patient will be residing, and identified as the person(s) who will aid the patient in the event of a mental health crisis (suicidal ideations/suicide attempt).  With written consent from the patient, the family member/significant other has been provided the following suicide prevention education, prior to the and/or following the discharge of the patient.  The suicide prevention education provided includes the following: Suicide risk factors Suicide prevention and interventions National Suicide Hotline telephone number [facility] assessment telephone number [facility] Emergency Assistance 911 [facility] and/or Residential Mobile Crisis Unit telephone number  Request made of family/significant other to: Remove weapons (e.g., guns, rifles, knives), all items previously/currently identified as safety concern.   Remove drugs/medications (over-the-counter, prescriptions, illicit drugs), all items previously/currently identified as a safety concern.  The family member/significant other verbalizes understanding of the suicide prevention education information provided.  The family member/significant other agrees to remove the items of safety concern listed above.  CSW spoke to patient and gave pamphlet for SPI  Dicie Edelen O Karin Griffith 01/23/2024, 11:09 AM

## 2024-01-23 NOTE — Progress Notes (Signed)
Pt discharged to lobby. Pt was stable and appreciative at that time. All papers and electronic prescriptions were given and valuables returned. Suicide safety plan completed. Verbal understanding expressed. Denies SI/HI and A/VH. Pt given opportunity to express concerns and ask questions. 

## 2024-01-23 NOTE — Group Note (Signed)
 Date:  01/23/2024 Time:  8:57 AM  Group Topic/Focus:  Goals Group:   The focus of this group is to help patients establish daily goals to achieve during treatment and discuss how the patient can incorporate goal setting into their daily lives to aide in recovery. Orientation:   The focus of this group is to educate the patient on the purpose and policies of crisis stabilization and provide a format to answer questions about their admission.  The group details unit policies and expectations of patients while admitted.    Participation Level:  Active  Participation Quality:  Appropriate  Affect:  Appropriate  Cognitive:  Appropriate  Insight: Appropriate  Engagement in Group:  Engaged  Modes of Intervention:  Discussion and Orientation  Additional Comments:    Violette Grief 01/23/2024, 8:57 AM

## 2024-03-14 ENCOUNTER — Telehealth: Payer: Self-pay

## 2024-03-14 NOTE — Telephone Encounter (Signed)
 Copied from CRM [id number]. Topic: General - Other >> Mar 14, 2024  2:14 PM Kevelyn M wrote: Reason for CRM: Attempted to reach patient 3 times after being released from Psych facility for a psych followup. Also sent a letter with no response.

## 2024-03-31 ENCOUNTER — Encounter: Payer: Self-pay | Admitting: Nurse Practitioner

## 2024-09-29 ENCOUNTER — Ambulatory Visit: Payer: Self-pay | Admitting: Nurse Practitioner

## 2024-09-29 ENCOUNTER — Encounter: Payer: Self-pay | Admitting: Nurse Practitioner

## 2024-09-29 VITALS — BP 107/72 | HR 77 | Wt 141.0 lb

## 2024-09-29 DIAGNOSIS — Z1329 Encounter for screening for other suspected endocrine disorder: Secondary | ICD-10-CM | POA: Diagnosis not present

## 2024-09-29 DIAGNOSIS — E559 Vitamin D deficiency, unspecified: Secondary | ICD-10-CM

## 2024-09-29 DIAGNOSIS — Z0000 Encounter for general adult medical examination without abnormal findings: Secondary | ICD-10-CM | POA: Diagnosis not present

## 2024-09-29 DIAGNOSIS — R079 Chest pain, unspecified: Secondary | ICD-10-CM

## 2024-09-29 DIAGNOSIS — Z13220 Encounter for screening for lipoid disorders: Secondary | ICD-10-CM | POA: Diagnosis not present

## 2024-09-29 DIAGNOSIS — E538 Deficiency of other specified B group vitamins: Secondary | ICD-10-CM

## 2024-09-29 NOTE — Progress Notes (Signed)
 "  Subjective   Patient ID: Cynthia Moran, female    DOB: 2001-05-29, 23 y.o.   MRN: [id number]  Chief Complaint  Patient presents with   Transitions Of Care   Fatigue    Wakes up tired     Referring provider: Hanford Powell BRAVO, NP  Monico Space is a 23 y.o. female with Past Medical History: No date: ADHD (attention deficit hyperactivity disorder)     Comment:  ADHD No date: Anxiety No date: Bipolar disorder (HCC) No date: Depression   HPI   Patient presents today to establish care.  Overall she is doing well.  She does have a history of mental health issues including depression and suicide attempts.  She is not currently seeing psychiatry and declines psychiatry referral at this time.  She is not currently on medications.  She does complain of fatigue.  She does have irregular sleep cycle also and we discussed that she does need to get on a better sleep schedule. Denies f/c/s, n/v/d, hemoptysis, PND, leg swelling Denies chest pain or edema     Allergies[1]  Immunization History  Administered Date(s) Administered   DTaP 01/03/1999, 03/03/1999, 05/07/1999, 04/13/2000, 11/01/2002   HIB (PRP-OMP) 01/03/1999, 03/03/1999, 05/07/1999, 04/13/2000   HIB (PRP-T) 01/03/1999, 03/03/1999, 05/07/1999, 04/13/2000   HPV Quadrivalent 05/18/2011, 07/21/2011, 12/14/2011   Hepatitis A 06/30/2006, 05/18/2011   Hepatitis A, Adult 06/30/2006, 05/18/2011   Hepatitis A, Ped/Adol-2 Dose 06/30/2006, 05/18/2011   Hepatitis B 12/03/1998, 01/03/1999, 08/15/1999   Hepatitis B, PED/ADOLESCENT 12/03/1998, 01/03/1999, 08/15/1999   IPV 01/03/1999, 03/03/1999, 12/11/1999, 11/01/2002   Influenza-Unspecified 11/14/2012, 07/10/2013, 08/09/2014   MMR 12/11/1999, 11/01/2002   Meningococcal Conjugate 05/18/2011, 01/15/2016   Moderna Sars-Covid-2 Vaccination 10/11/2020   Pneumococcal-Unspecified 03/03/1999, 05/07/1999, 08/15/1999, 12/11/1999   Td 05/18/2011   Tdap 05/18/2011   Varicella 12/11/1999,  06/30/2006    Tobacco History: Tobacco Use History[2] Counseling given: Not Answered   Outpatient Encounter Medications as of 09/29/2024  Medication Sig   escitalopram  (LEXAPRO ) 5 MG tablet Take 1 tablet (5 mg total) by mouth at bedtime. (Patient not taking: Reported on 09/29/2024)   hydrOXYzine  (ATARAX ) 25 MG tablet Take 1 tablet (25 mg total) by mouth 3 (three) times daily as needed for anxiety. (Patient not taking: Reported on 09/29/2024)   lurasidone  (LATUDA ) 40 MG TABS tablet Take 1 tablet (40 mg total) by mouth at bedtime. (Patient not taking: Reported on 09/29/2024)   traZODone  (DESYREL ) 50 MG tablet Take 1 tablet (50 mg total) by mouth at bedtime as needed for sleep. (Patient not taking: Reported on 09/29/2024)   No facility-administered encounter medications on file as of 09/29/2024.    Review of Systems  Review of Systems  Constitutional: Negative.   HENT: Negative.    Cardiovascular: Negative.   Gastrointestinal: Negative.   Allergic/Immunologic: Negative.   Neurological: Negative.   Psychiatric/Behavioral: Negative.       Objective:   BP 107/72 (BP Location: Left Arm, Patient Position: Sitting, Cuff Size: Large)   Pulse 77   Wt 141 lb (64 kg)   SpO2 98%   BMI 25.79 kg/m   Wt Readings from Last 5 Encounters:  09/29/24 141 lb (64 kg)  01/18/24 130 lb (59 kg)  02/27/22 123 lb (55.8 kg)  02/25/22 123 lb 6.4 oz (56 kg)  02/19/22 120 lb (54.4 kg)     Physical Exam Vitals and nursing note reviewed.  Constitutional:      General: She is not in acute distress.    Appearance: She is well-developed.  Cardiovascular:     Rate and Rhythm: Normal rate and regular rhythm.  Pulmonary:     Effort: Pulmonary effort is normal.     Breath sounds: Normal breath sounds.  Neurological:     Mental Status: She is alert and oriented to person, place, and time.       Assessment & Plan:   Routine adult health maintenance -     CBC -     Comprehensive metabolic  panel with GFR  Lipid screening -     Lipid panel  Vitamin D  deficiency -     VITAMIN D  25 Hydroxy (Vit-D Deficiency, Fractures)  Vitamin B12 deficiency -     Vitamin B12  Thyroid disorder screen -     TSH  Intermittent chest pain -     Ambulatory referral to Cardiology     Return in about 3 months (around 12/28/2024).   Bascom GORMAN Borer, NP 09/29/2024     [1]  Allergies Allergen Reactions   Cat Dander Other (See Comments)    Runny nose, watery eyes  [2]  Social History Tobacco Use  Smoking Status Never  Smokeless Tobacco Never   "

## 2024-09-30 LAB — CBC
Hematocrit: 39.7 % (ref 34.0–46.6)
Hemoglobin: 12.8 g/dL (ref 11.1–15.9)
MCH: 29 pg (ref 26.6–33.0)
MCHC: 32.2 g/dL (ref 31.5–35.7)
MCV: 90 fL (ref 79–97)
Platelets: 294 x10E3/uL (ref 150–450)
RBC: 4.42 x10E6/uL (ref 3.77–5.28)
RDW: 12.5 % (ref 11.7–15.4)
WBC: 7.6 x10E3/uL (ref 3.4–10.8)

## 2024-09-30 LAB — COMPREHENSIVE METABOLIC PANEL WITH GFR
ALT: 19 IU/L (ref 0–32)
AST: 17 IU/L (ref 0–40)
Albumin: 4.3 g/dL (ref 4.0–5.0)
Alkaline Phosphatase: 71 IU/L (ref 41–116)
BUN/Creatinine Ratio: 14 (ref 9–23)
BUN: 12 mg/dL (ref 6–20)
Bilirubin Total: 0.3 mg/dL (ref 0.0–1.2)
CO2: 22 mmol/L (ref 20–29)
Calcium: 9.3 mg/dL (ref 8.7–10.2)
Chloride: 100 mmol/L (ref 96–106)
Creatinine, Ser: 0.86 mg/dL (ref 0.57–1.00)
Globulin, Total: 2.4 g/dL (ref 1.5–4.5)
Glucose: 88 mg/dL (ref 70–99)
Potassium: 4.5 mmol/L (ref 3.5–5.2)
Sodium: 135 mmol/L (ref 134–144)
Total Protein: 6.7 g/dL (ref 6.0–8.5)
eGFR: 96 mL/min/1.73

## 2024-09-30 LAB — LIPID PANEL
Chol/HDL Ratio: 2.1 ratio (ref 0.0–4.4)
Cholesterol, Total: 202 mg/dL — ABNORMAL HIGH (ref 100–199)
HDL: 95 mg/dL
LDL Chol Calc (NIH): 94 mg/dL (ref 0–99)
Triglycerides: 73 mg/dL (ref 0–149)
VLDL Cholesterol Cal: 13 mg/dL (ref 5–40)

## 2024-09-30 LAB — TSH: TSH: 1.43 u[IU]/mL (ref 0.450–4.500)

## 2024-09-30 LAB — VITAMIN B12: Vitamin B-12: 595 pg/mL (ref 232–1245)

## 2024-09-30 LAB — VITAMIN D 25 HYDROXY (VIT D DEFICIENCY, FRACTURES): Vit D, 25-Hydroxy: 26.1 ng/mL — ABNORMAL LOW (ref 30.0–100.0)

## 2024-10-03 ENCOUNTER — Ambulatory Visit: Payer: Self-pay | Admitting: Nurse Practitioner

## 2024-10-03 ENCOUNTER — Other Ambulatory Visit: Payer: Self-pay | Admitting: Medical Genetics

## 2024-10-14 NOTE — Progress Notes (Unsigned)
" ° °  Cardiology [facility]:    Date:  10/14/2024   ID:  Arron Mcnaught, DOB [birth year], MRN [id number]  PCP:  Oley Bascom RAMAN, NP  Cardiologist:  None { Click to update primary MD,subspecialty MD or APP then REFRESH:1}    Referring MD: Oley Bascom RAMAN, NP   Chief Complaint: chest pain  History of Present Illness:    Cynthia Moran is a 24 y.o. female with a history of ADHD, anxiety, depression with prior suicide attempts, and bipolar disorder who presents today as a new patient in the [facility] for further evaluation of chest pain.   ***  Chest Pain ***   EKGs/Labs/Other Studies Reviewed:    The following studies were reviewed:  N/A  EKG:  EKG ordered today. EKG personally reviewed and demonstrates ***.  Recent Labs: 09/29/2024: ALT 19; BUN 12; Creatinine, Ser 0.86; Hemoglobin 12.8; Platelets 294; Potassium 4.5; Sodium 135; TSH 1.430  Recent Lipid Panel    Component Value Date/Time   CHOL 202 (H) 09/29/2024 0844   TRIG 73 09/29/2024 0844   HDL 95 09/29/2024 0844   CHOLHDL 2.1 09/29/2024 0844   CHOLHDL 2.4 01/20/2024 0631   VLDL 9 01/20/2024 0631   LDLCALC 94 09/29/2024 0844    Physical Exam:    Vital Signs: There were no vitals taken for this visit.    Wt Readings from Last 3 Encounters:  09/29/24 141 lb (64 kg)  01/18/24 130 lb (59 kg)  02/27/22 123 lb (55.8 kg)     General: 24 y.o. female in no acute distress. HEENT: Normocephalic and atraumatic. Sclera clear.  Neck: Supple. No carotid bruits. No JVD. Heart: *** RRR. Distinct S1 and S2. No murmurs, gallops, or rubs.  Lungs: No increased work of breathing. Clear to ausculation bilaterally. No wheezes, rhonchi, or rales.  Abdomen: Soft, non-distended, and non-tender to palpation.  Extremities: No lower extremity edema.  Radial and distal pedal pulses 2+ and equal bilaterally. Skin: Warm and dry. Neuro: No focal deficits. Psych: Normal affect. Responds appropriately.   Assessment:    No  diagnosis found.  Plan:     Disposition: Follow up in ***   Signed, Rahkim Rabalais E Tahara Ruffini, PA-C  10/14/2024 4:11 PM    [facility] "

## 2024-10-19 ENCOUNTER — Ambulatory Visit: Attending: Student | Admitting: Student

## 2024-10-24 ENCOUNTER — Other Ambulatory Visit: Payer: Self-pay | Admitting: Medical Genetics

## 2024-10-24 DIAGNOSIS — Z006 Encounter for examination for normal comparison and control in clinical research program: Secondary | ICD-10-CM
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
## 2024-11-07 ENCOUNTER — Ambulatory Visit: Admitting: General Practice
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
## 2024-11-17 ENCOUNTER — Ambulatory Visit: Admitting: General Practice

## 2024-11-17 ENCOUNTER — Encounter: Payer: Self-pay | Admitting: General Practice

## 2024-11-17 VITALS — BP 102/68 | HR 75 | Ht 62.0 in | Wt 139.0 lb

## 2024-11-17 DIAGNOSIS — F17200 Nicotine dependence, unspecified, uncomplicated: Secondary | ICD-10-CM

## 2024-11-17 DIAGNOSIS — R079 Chest pain, unspecified: Secondary | ICD-10-CM

## 2024-11-17 DIAGNOSIS — G479 Sleep disorder, unspecified: Secondary | ICD-10-CM

## 2024-11-17 MED ORDER — PANTOPRAZOLE SODIUM 20 MG PO TBEC: 20.0000 mg | DELAYED_RELEASE_TABLET | Freq: Every day | ORAL | 0 refills | Status: AC

## 2024-11-17 NOTE — Patient Instructions (Signed)
 Medication Instructions:  Your physician has recommended you make the following change in your medication:  1- START Protonix  20 mg by mouth daily.  *If you need a refill on your cardiac medications before your next appointment, please call your pharmacy*  Lab Work: None ordered today If you have labs (blood work) drawn today and your tests are completely normal, you will receive your results only by: MyChart Message (if you have MyChart) OR A paper copy in the mail If you have any lab test that is abnormal or we need to change your treatment, we will call you to review the results.  Testing/Procedures: None ordered today.  Follow-Up: At [facility], you and your health needs are our priority.  As part of our continuing mission to provide you with exceptional heart care, our providers are all part of one team.  This team includes your primary Cardiologist (physician) and Advanced Practice Providers or APPs (Physician Assistants and Nurse Practitioners) who all work together to provide you with the care you need, when you need it.  Your next appointment:   As needed  Provider:   Josefa Beauvais, NP    We recommend signing up for the patient portal called MyChart.  Sign up information is provided on this After Visit Summary.  MyChart is used to connect with patients for Virtual Visits (Telemedicine).  Patients are able to view lab/test results, encounter notes, upcoming appointments, etc.  Non-urgent messages can be sent to your provider as well.   To learn more about what you can do with MyChart, go to [email or web address].   Other Instructions

## 2024-11-21 ENCOUNTER — Other Ambulatory Visit

## 2024-12-28 ENCOUNTER — Ambulatory Visit: Payer: Self-pay | Admitting: Nurse Practitioner
